# Patient Record
Sex: Male | Born: 1955
Health system: Southern US, Community
[De-identification: ages and names within clinical notes are randomized; demographics above are authoritative.]

## PROBLEM LIST (undated history)

## (undated) DIAGNOSIS — I219 Acute myocardial infarction, unspecified: Secondary | ICD-10-CM

## (undated) DIAGNOSIS — Q211 Atrial septal defect: Secondary | ICD-10-CM

## (undated) DIAGNOSIS — Z8673 Personal history of transient ischemic attack (TIA), and cerebral infarction without residual deficits: Secondary | ICD-10-CM

## (undated) DIAGNOSIS — K118 Other diseases of salivary glands: Secondary | ICD-10-CM

## (undated) DIAGNOSIS — R600 Localized edema: Secondary | ICD-10-CM

## (undated) DIAGNOSIS — I639 Cerebral infarction, unspecified: Secondary | ICD-10-CM

## (undated) DIAGNOSIS — E785 Hyperlipidemia, unspecified: Secondary | ICD-10-CM

## (undated) DIAGNOSIS — I519 Heart disease, unspecified: Secondary | ICD-10-CM

## (undated) DIAGNOSIS — I10 Essential (primary) hypertension: Secondary | ICD-10-CM

## (undated) DIAGNOSIS — I2510 Atherosclerotic heart disease of native coronary artery without angina pectoris: Secondary | ICD-10-CM

## (undated) DIAGNOSIS — R7303 Prediabetes: Secondary | ICD-10-CM

## (undated) DIAGNOSIS — F32A Depression, unspecified: Secondary | ICD-10-CM

## (undated) DIAGNOSIS — F329 Major depressive disorder, single episode, unspecified: Secondary | ICD-10-CM

## (undated) DIAGNOSIS — K111 Hypertrophy of salivary gland: Secondary | ICD-10-CM

## (undated) HISTORY — DX: Hyperlipidemia, unspecified: E78.5

## (undated) HISTORY — DX: Prediabetes: R73.03

## (undated) HISTORY — DX: Localized edema: R60.0

## (undated) HISTORY — DX: Other diseases of salivary glands: K11.8

## (undated) HISTORY — DX: Acute myocardial infarction, unspecified: I21.9

## (undated) HISTORY — DX: Hypertrophy of salivary gland: K11.1

## (undated) HISTORY — DX: Depression, unspecified: F32.A

## (undated) HISTORY — DX: Essential (primary) hypertension: I10

## (undated) HISTORY — DX: Personal history of transient ischemic attack (TIA), and cerebral infarction without residual deficits: Z86.73

---
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
## 2017-03-23 DIAGNOSIS — I639 Cerebral infarction, unspecified: Secondary | ICD-10-CM

## 2017-03-23 HISTORY — DX: Cerebral infarction, unspecified: I63.9

## 2017-11-23 ENCOUNTER — Other Ambulatory Visit: Payer: Self-pay

## 2017-11-23 ENCOUNTER — Emergency Department (HOSPITAL_COMMUNITY): Payer: Self-pay

## 2017-11-23 ENCOUNTER — Inpatient Hospital Stay (HOSPITAL_COMMUNITY)
Admission: EM | Admit: 2017-11-23 | Discharge: 2017-11-25 | DRG: 065 | Discharge status: Against Medical Advice | Payer: Self-pay | Attending: Internal Medicine | Admitting: Internal Medicine

## 2017-11-23 ENCOUNTER — Encounter (HOSPITAL_COMMUNITY): Payer: Self-pay

## 2017-11-23 DIAGNOSIS — I639 Cerebral infarction, unspecified: Secondary | ICD-10-CM

## 2017-11-23 DIAGNOSIS — F0150 Vascular dementia without behavioral disturbance: Secondary | ICD-10-CM | POA: Diagnosis present

## 2017-11-23 DIAGNOSIS — R270 Ataxia, unspecified: Secondary | ICD-10-CM | POA: Diagnosis present

## 2017-11-23 DIAGNOSIS — N289 Disorder of kidney and ureter, unspecified: Secondary | ICD-10-CM | POA: Diagnosis present

## 2017-11-23 DIAGNOSIS — Q211 Atrial septal defect: Secondary | ICD-10-CM

## 2017-11-23 DIAGNOSIS — I255 Ischemic cardiomyopathy: Secondary | ICD-10-CM | POA: Diagnosis present

## 2017-11-23 DIAGNOSIS — Z955 Presence of coronary angioplasty implant and graft: Secondary | ICD-10-CM

## 2017-11-23 DIAGNOSIS — R609 Edema, unspecified: Secondary | ICD-10-CM | POA: Diagnosis present

## 2017-11-23 DIAGNOSIS — G9340 Encephalopathy, unspecified: Secondary | ICD-10-CM | POA: Diagnosis present

## 2017-11-23 DIAGNOSIS — E663 Overweight: Secondary | ICD-10-CM | POA: Diagnosis present

## 2017-11-23 DIAGNOSIS — H53461 Homonymous bilateral field defects, right side: Secondary | ICD-10-CM | POA: Diagnosis present

## 2017-11-23 DIAGNOSIS — G8191 Hemiplegia, unspecified affecting right dominant side: Secondary | ICD-10-CM | POA: Diagnosis present

## 2017-11-23 DIAGNOSIS — I63432 Cerebral infarction due to embolism of left posterior cerebral artery: Principal | ICD-10-CM | POA: Diagnosis present

## 2017-11-23 DIAGNOSIS — I252 Old myocardial infarction: Secondary | ICD-10-CM

## 2017-11-23 DIAGNOSIS — Z6828 Body mass index (BMI) 28.0-28.9, adult: Secondary | ICD-10-CM

## 2017-11-23 DIAGNOSIS — I2510 Atherosclerotic heart disease of native coronary artery without angina pectoris: Secondary | ICD-10-CM | POA: Diagnosis present

## 2017-11-23 DIAGNOSIS — Z8673 Personal history of transient ischemic attack (TIA), and cerebral infarction without residual deficits: Secondary | ICD-10-CM

## 2017-11-23 DIAGNOSIS — K118 Other diseases of salivary glands: Secondary | ICD-10-CM | POA: Diagnosis present

## 2017-11-23 DIAGNOSIS — R471 Dysarthria and anarthria: Secondary | ICD-10-CM | POA: Diagnosis present

## 2017-11-23 DIAGNOSIS — R410 Disorientation, unspecified: Secondary | ICD-10-CM

## 2017-11-23 DIAGNOSIS — R29810 Facial weakness: Secondary | ICD-10-CM | POA: Diagnosis present

## 2017-11-23 DIAGNOSIS — F17210 Nicotine dependence, cigarettes, uncomplicated: Secondary | ICD-10-CM | POA: Diagnosis present

## 2017-11-23 DIAGNOSIS — R29705 NIHSS score 5: Secondary | ICD-10-CM | POA: Diagnosis present

## 2017-11-23 HISTORY — DX: Heart disease, unspecified: I51.9

## 2017-11-23 LAB — COMPREHENSIVE METABOLIC PANEL
ALT: 49 U/L — ABNORMAL HIGH (ref 0–44)
AST: 27 U/L (ref 15–41)
Albumin: 4 g/dL (ref 3.5–5.0)
Alkaline Phosphatase: 94 U/L (ref 38–126)
Anion gap: 12 (ref 5–15)
BUN: 13 mg/dL (ref 8–23)
CO2: 21 mmol/L — ABNORMAL LOW (ref 22–32)
Calcium: 9.2 mg/dL (ref 8.9–10.3)
Chloride: 106 mmol/L (ref 98–111)
Creatinine, Ser: 1.31 mg/dL — ABNORMAL HIGH (ref 0.61–1.24)
GFR calc Af Amer: >60 mL/min (ref >60)
GFR calc non Af Amer: 56 mL/min — ABNORMAL LOW (ref >60)
Glucose, Bld: 130 mg/dL — ABNORMAL HIGH (ref 70–99)
Potassium: 3.8 mmol/L (ref 3.5–5.1)
Sodium: 139 mmol/L (ref 135–145)
Total Bilirubin: 1 mg/dL (ref 0.3–1.2)
Total Protein: 7.1 g/dL (ref 6.5–8.1)

## 2017-11-23 LAB — CBC
HCT: 49.8 % (ref 39.0–52.0)
Hemoglobin: 17 g/dL (ref 13.0–17.0)
MCH: 31.4 pg (ref 26.0–34.0)
MCHC: 34.1 g/dL (ref 30.0–36.0)
MCV: 91.9 fL (ref 78.0–100.0)
Platelets: 245 10*3/uL (ref 150–400)
RBC: 5.42 MIL/uL (ref 4.22–5.81)
RDW: 12.8 % (ref 11.5–15.5)
WBC: 11.6 10*3/uL — ABNORMAL HIGH (ref 4.0–10.5)

## 2017-11-23 LAB — CBG MONITORING, ED: Glucose-Capillary: 120 mg/dL — ABNORMAL HIGH (ref 70–99)

## 2017-11-23 MED ORDER — ACETAMINOPHEN 500 MG PO TABS
500.0000 mg | ORAL_TABLET | Freq: Once | ORAL | Status: AC
  Administered 2017-11-23: 500 mg via ORAL
  Filled 2017-11-23: qty 1

## 2017-11-23 NOTE — ED Triage Notes (Signed)
Pt from home with increased confusion over the last week.  Wife reports increased difficulty with fine motor skills.  Mass to the right jaw line noticed and patient does not remember seeing it before, mass is solid and non movable.  Also reports headache and just wants to go to sleep.

## 2017-11-23 NOTE — ED Provider Notes (Signed)
[facility] EMERGENCY DEPARTMENT Provider Note   CSN: [id number] Arrival date & time: 11/23/17  2101     History   Chief Complaint Chief Complaint  Patient presents with  . Altered Mental Status    HPI James [name] is a 62 y.o. male.  The history is provided by the patient.  Altered Mental Status   This is a new problem. The current episode started more than 2 days ago. The problem has not changed since onset.Associated symptoms include confusion and weakness. Pertinent negatives include no seizures. His past medical history does not include head trauma.    History reviewed. No pertinent past medical history.  Patient Active Problem List   Diagnosis Date Noted  . Acute encephalopathy 11/24/2017    History reviewed. No pertinent surgical history.      Home Medications    Prior to Admission medications   Not on File    Family History History reviewed. No pertinent family history.  Social History Social History   Tobacco Use  . Smoking status: Current Some Day Smoker  . Smokeless tobacco: Never Used  Substance Use Topics  . Alcohol use: Never    Frequency: Never  . Drug use: Never     Allergies   Patient has no known allergies.   Review of Systems Review of Systems  Constitutional: Negative for chills and fever.  HENT: Negative for ear pain and sore throat.        + right jaw facial mass for last 6 months  Eyes: Negative for pain and visual disturbance.  Respiratory: Negative for shortness of breath.   Cardiovascular: Negative for chest pain and palpitations.  Gastrointestinal: Negative for abdominal pain and vomiting.  Genitourinary: Negative for dysuria and hematuria.  Musculoskeletal: Negative for arthralgias and back pain.  Skin: Negative for wound.  Neurological: Positive for weakness. Negative for seizures and syncope.  Psychiatric/Behavioral: Positive for confusion.  All other systems reviewed and are  negative.    Physical Exam Updated Vital Signs BP (!) 136/92   Pulse 84   Temp 98.2 F (36.8 C) (Oral)   Resp 14   Ht 6' (1.829 m)   Wt 72.6 kg   SpO2 94%   BMI 21.70 kg/m   Physical Exam  Constitutional: He appears well-developed and well-nourished.  HENT:  Head: Normocephalic and atraumatic.  Eyes: Conjunctivae are normal.  Neck: Neck supple.  Cardiovascular: Normal rate and regular rhythm.  No murmur heard. Pulmonary/Chest: Effort normal and breath sounds normal. No respiratory distress.  Abdominal: Soft. There is no tenderness.  Musculoskeletal: He exhibits no edema.  Neurological: He is alert.  Oriented to self, 5/5 strength with flexion and extension of bilateral upper and lower extremities.  Slightly diminished sensation over the right upper and lower extremity.  Finger-to-nose normal both sides.  Mild right-sided pronator drift.  Skin: Skin is warm and dry.  Psychiatric: He has a normal mood and affect.  Nursing note and vitals reviewed.    ED Treatments / Results  Labs (all labs ordered are listed, but only abnormal results are displayed) Labs Reviewed  COMPREHENSIVE METABOLIC PANEL - Abnormal; Notable for the following components:      Result Value   CO2 21 (*)    Glucose, Bld 130 (*)    Creatinine, Ser 1.31 (*)    ALT 49 (*)    GFR calc non Af Amer 56 (*)    All other components within normal limits  CBC - Abnormal; Notable for  the following components:   WBC 11.6 (*)    All other components within normal limits  CBG MONITORING, ED - Abnormal; Notable for the following components:   Glucose-Capillary 120 (*)    All other components within normal limits    EKG EKG Interpretation  Date/Time:  Tuesday November 23 2017 21:10:06 EDT Ventricular Rate:  96 PR Interval:    QRS Duration: 93 QT Interval:  352 QTC Calculation: 445 R Axis:   -34 Text Interpretation:  Sinus rhythm Probable left atrial enlargement Left axis deviation Abnormal R-wave  progression, early transition Abnormal ekg Confirmed by Carmin Muskrat [phone]) on 11/23/2017 9:20:14 PM   Radiology Ct Head Wo Contrast  Result Date: 11/23/2017 CLINICAL DATA:  Ataxia EXAM: CT HEAD WITHOUT CONTRAST TECHNIQUE: Contiguous axial images were obtained from the base of the skull through the vertex without intravenous contrast. COMPARISON:  None. FINDINGS: Brain: No hemorrhage or intracranial mass is visualized. Mild atrophy. Mild small vessel ischemic changes of the white matter. Probable chronic left thalamic infarct and small suspected chronic left occipital infarct. Age indeterminate infarct within the medial left temporal lobe. Ventricles are nonenlarged. Vascular: No hyperdense vessels. Vertebral artery and carotid vascular calcification Skull: No fracture Sinuses/Orbits: Mucosal thickening in the ethmoid and maxillary sinuses. Other: None IMPRESSION: 1. Age indeterminate infarct within the medial left temporal lobe. Small infarcts in the left occipital lobe and left thalamus, probably chronic. 2. Atrophy with mild small vessel ischemic changes of the white matter. Electronically Signed   By: Donavan Foil M.D.   On: 11/23/2017 22:48    Procedures Procedures (including critical care time)  Medications Ordered in ED Medications  acetaminophen (TYLENOL) tablet 500 mg (500 mg Oral Given 11/23/17 2252)     Initial Impression / Assessment and Plan / ED Course  I have reviewed the triage vital signs and the nursing notes.  Pertinent labs & imaging results that were available during my care of the patient were reviewed by me and considered in my medical decision making (see chart for details).     Patient is a 62 year old male with past medical history CAD who presents with confusion and nonspecific weakness.  Physical exam reveals ataxia and right-sided pronator drift.  Patient also has subjectively decreased sensation over the right side.  CT head shows age-indeterminate stroke.   Point-of-care glucose 120.  CBC reveals mild leukocytosis.  CMP reveals no electrolyte abnormalities, creatinine 1.31, and no significantly elevated LFTs.  Patient remains hemodynamically stable in the emergency department. Discussed with Dr. Cheral Marker at 12:55 AM.  Patient will be admitted to internal medicine.  Patient care supervised by Dr. Vanita Panda.  Irven Baltimore, MD  Final Clinical Impressions(s) / ED Diagnoses   Final diagnoses:  None    ED Discharge Orders    None       Irven Baltimore, MD 11/24/17 1191    Carmin Muskrat, MD 11/24/17 [phone]

## 2017-11-24 ENCOUNTER — Observation Stay (HOSPITAL_COMMUNITY): Payer: Self-pay

## 2017-11-24 ENCOUNTER — Encounter (HOSPITAL_COMMUNITY): Payer: Self-pay | Admitting: Family Medicine

## 2017-11-24 ENCOUNTER — Observation Stay (HOSPITAL_BASED_OUTPATIENT_CLINIC_OR_DEPARTMENT_OTHER): Payer: Self-pay

## 2017-11-24 ENCOUNTER — Other Ambulatory Visit: Payer: Self-pay | Admitting: Cardiology

## 2017-11-24 DIAGNOSIS — R609 Edema, unspecified: Secondary | ICD-10-CM

## 2017-11-24 DIAGNOSIS — N289 Disorder of kidney and ureter, unspecified: Secondary | ICD-10-CM

## 2017-11-24 DIAGNOSIS — G459 Transient cerebral ischemic attack, unspecified: Secondary | ICD-10-CM

## 2017-11-24 DIAGNOSIS — Z8673 Personal history of transient ischemic attack (TIA), and cerebral infarction without residual deficits: Secondary | ICD-10-CM

## 2017-11-24 DIAGNOSIS — I639 Cerebral infarction, unspecified: Secondary | ICD-10-CM

## 2017-11-24 DIAGNOSIS — G9340 Encephalopathy, unspecified: Secondary | ICD-10-CM | POA: Diagnosis present

## 2017-11-24 LAB — BASIC METABOLIC PANEL
Anion gap: 10 (ref 5–15)
BUN: 16 mg/dL (ref 8–23)
CO2: 23 mmol/L (ref 22–32)
Calcium: 8.9 mg/dL (ref 8.9–10.3)
Chloride: 107 mmol/L (ref 98–111)
Creatinine, Ser: 1.04 mg/dL (ref 0.61–1.24)
GFR calc Af Amer: >60 mL/min (ref >60)
GFR calc non Af Amer: >60 mL/min (ref >60)
Glucose, Bld: 93 mg/dL (ref 70–99)
Potassium: 3.7 mmol/L (ref 3.5–5.1)
Sodium: 140 mmol/L (ref 135–145)

## 2017-11-24 LAB — LIPID PANEL
Cholesterol: 204 mg/dL — ABNORMAL HIGH (ref 0–200)
HDL: 34 mg/dL — ABNORMAL LOW (ref >40)
LDL Cholesterol: 143 mg/dL — ABNORMAL HIGH (ref 0–99)
Total CHOL/HDL Ratio: 6 RATIO
Triglycerides: 135 mg/dL (ref <150)
VLDL: 27 mg/dL (ref 0–40)

## 2017-11-24 LAB — RAPID URINE DRUG SCREEN, HOSP PERFORMED
Amphetamines: NOT DETECTED
Barbiturates: NOT DETECTED
Benzodiazepines: NOT DETECTED
Cocaine: NOT DETECTED
Opiates: NOT DETECTED
Tetrahydrocannabinol: NOT DETECTED

## 2017-11-24 LAB — URINALYSIS, COMPLETE (UACMP) WITH MICROSCOPIC
Bacteria, UA: NONE SEEN
Bilirubin Urine: NEGATIVE
Glucose, UA: NEGATIVE mg/dL
Hgb urine dipstick: NEGATIVE
Ketones, ur: NEGATIVE mg/dL
Leukocytes, UA: NEGATIVE
Nitrite: NEGATIVE
Protein, ur: NEGATIVE mg/dL
Specific Gravity, Urine: 1.024 (ref 1.005–1.030)
pH: 5 (ref 5.0–8.0)

## 2017-11-24 LAB — ECHOCARDIOGRAM COMPLETE
Height: 72 in
Weight: 3319.25 oz

## 2017-11-24 LAB — HIV ANTIBODY (ROUTINE TESTING W REFLEX): HIV Screen 4th Generation wRfx: NONREACTIVE

## 2017-11-24 LAB — HEMOGLOBIN A1C
Hgb A1c MFr Bld: 5.8 % — ABNORMAL HIGH (ref 4.8–5.6)
Mean Plasma Glucose: 119.76 mg/dL

## 2017-11-24 LAB — VITAMIN B12: Vitamin B-12: 310 pg/mL (ref 180–914)

## 2017-11-24 LAB — TSH: TSH: 1.255 u[IU]/mL (ref 0.350–4.500)

## 2017-11-24 MED ORDER — SODIUM CHLORIDE 0.9% FLUSH
3.0000 mL | Freq: Two times a day (BID) | INTRAVENOUS | Status: DC
  Administered 2017-11-24 – 2017-11-25 (×3): 3 mL via INTRAVENOUS

## 2017-11-24 MED ORDER — SODIUM CHLORIDE 0.9 % IV SOLN
INTRAVENOUS | Status: AC
  Administered 2017-11-24: 04:00:00 via INTRAVENOUS

## 2017-11-24 MED ORDER — ACETAMINOPHEN 325 MG PO TABS: 650.0000 mg | ORAL_TABLET | Freq: Four times a day (QID) | ORAL | Status: DC | PRN

## 2017-11-24 MED ORDER — ONDANSETRON HCL 4 MG/2ML IJ SOLN: 4.0000 mg | Freq: Four times a day (QID) | INTRAMUSCULAR | Status: DC | PRN

## 2017-11-24 MED ORDER — ACETAMINOPHEN 650 MG RE SUPP: 650.0000 mg | Freq: Four times a day (QID) | RECTAL | Status: DC | PRN

## 2017-11-24 MED ORDER — ENOXAPARIN SODIUM 40 MG/0.4ML ~~LOC~~ SOLN
40.0000 mg | SUBCUTANEOUS | Status: DC
  Administered 2017-11-24 – 2017-11-25 (×2): 40 mg via SUBCUTANEOUS
  Filled 2017-11-24 (×2): qty 0.4

## 2017-11-24 MED ORDER — SENNOSIDES-DOCUSATE SODIUM 8.6-50 MG PO TABS: 1.0000 | ORAL_TABLET | Freq: Every evening | ORAL | Status: DC | PRN

## 2017-11-24 MED ORDER — ATORVASTATIN CALCIUM 80 MG PO TABS
80.0000 mg | ORAL_TABLET | Freq: Every day | ORAL | Status: DC
  Administered 2017-11-24: 80 mg via ORAL
  Filled 2017-11-24: qty 1

## 2017-11-24 MED ORDER — ASPIRIN 300 MG RE SUPP: 300.0000 mg | Freq: Every day | RECTAL | Status: DC | PRN

## 2017-11-24 MED ORDER — ASPIRIN 325 MG PO TABS
325.0000 mg | ORAL_TABLET | Freq: Every day | ORAL | Status: DC
  Administered 2017-11-24: 325 mg via ORAL
  Filled 2017-11-24: qty 1

## 2017-11-24 MED ORDER — ONDANSETRON HCL 4 MG PO TABS: 4.0000 mg | ORAL_TABLET | Freq: Four times a day (QID) | ORAL | Status: DC | PRN

## 2017-11-24 NOTE — Progress Notes (Addendum)
The patient seen and examined, admitted earlier this morning by Dr.Opyd Briefly 62 year old male who does not seek regular medical care, remote history of CAD and stents from 5 years ago in ER, was brought to the emergency room by his girlfriend for evaluation of ongoing confusion, fine motor weakness in coordination for approximately 1 week -CT in the emergency room noted subacute infarcts in the medial left temporal lobe and left occipital lobe, left thalamus with atrophy and mild small vessel white matter changes.  1. Subacute CVA -With ongoing physical and cognitive decline -MRI noted Patchy L PCA infarcts involving the L occipital, splenium and thalamus, embolic source suspected -Neurology consulting -Follow-up 2-D echocardiogram and carotid duplex -LDL 143, Hba1c is 5.8 -PT/OT/SLP evaluations -continue aspirin 325 mg daily, started statin  2. Mild Renal insufficiency -continue gentle hydration for 24 hours -Baseline creatinine unknown  3. Right parotid swelling -recommend nonurgent ENT follow-up for this  4. Prior CAD -friend reports history of MI and 2 stents, in [place] approximately 4-5 years ago  Domenic Polite, MD

## 2017-11-24 NOTE — Progress Notes (Addendum)
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 The patient does not take an antiplatelet medication or an anticoagulant. He has no prior history of stroke. CT head obtained in the ED revealed a late-subacute appearing infarct within the medial left temporal lobe and similar likely late-subacute small infarcts in the left occipital lobe and left thalamus.   INTERVAL HISTORY His significant other is at the bedside.  She recounted HPI. He remains confused this am. He still cannot see to the R. Patient has a negative outlook about living according to his wife d/t deaths of his parents.  Dr. Leonie [name] discussed stroke, etiology, testing, prognosis with he and his significant other. Pt is not understanding. His sister is his next-of-kin, she lives in [place].  Vitals:   11/24/17 0130 11/24/17 0215 11/24/17 0430 11/24/17 0754  BP: 130/83 126/88 112/82 120/82  Pulse: 73 79 77 75  Resp: 18  16   Temp:  98.4 F (36.9 C) 98.1 F (36.7 C)   TempSrc:  Oral Oral   SpO2: 95% 95% 95% 98%  Weight:  94.1 kg    Height:        CBC:  Recent Labs  Lab 11/23/17 2112  WBC 11.6*  HGB 17.0  HCT 49.8  MCV 91.9  PLT 829    Basic Metabolic Panel:  Recent Labs  Lab 11/23/17 2112 11/24/17 0351  NA 139 140  K 3.8 3.7  CL 106 107  CO2 21* 23  GLUCOSE 130* 93  BUN 13 16  CREATININE 1.31* 1.04  CALCIUM 9.2 8.9   Lipid Panel:     Component Value Date/Time   CHOL 204 (H) 11/24/2017 0351   TRIG 135 11/24/2017 0351   HDL 34 (L) 11/24/2017 0351   CHOLHDL 6.0 11/24/2017 0351   VLDL 27 11/24/2017 0351   LDLCALC 143 (H) 11/24/2017 0351   HgbA1c:  Lab Results  Component Value Date   HGBA1C 5.8 (H) 11/24/2017   Urine Drug Screen:     Component Value Date/Time   LABOPIA NONE DETECTED 11/24/2017 0644    COCAINSCRNUR NONE DETECTED 11/24/2017 0644   LABBENZ NONE DETECTED 11/24/2017 0644   AMPHETMU NONE DETECTED 11/24/2017 0644   THCU NONE DETECTED 11/24/2017 0644   LABBARB NONE DETECTED 11/24/2017 0644    Alcohol Level No results found for: ETH  IMAGING Ct Head Wo Contrast  Result Date: 11/23/2017 CLINICAL DATA:  Ataxia EXAM: CT HEAD WITHOUT CONTRAST TECHNIQUE: Contiguous axial images were obtained from the base of the skull through the vertex without intravenous contrast. COMPARISON:  None. FINDINGS: Brain: No hemorrhage or intracranial mass is visualized. Mild atrophy. Mild small vessel ischemic changes of the white matter. Probable chronic left thalamic infarct and small suspected chronic left occipital infarct. Age indeterminate infarct within the medial left temporal lobe. Ventricles are nonenlarged. Vascular: No hyperdense vessels. Vertebral artery and carotid vascular calcification Skull: No fracture Sinuses/Orbits: Mucosal thickening in the ethmoid and maxillary sinuses. Other: None IMPRESSION: 1. Age indeterminate infarct within the medial left temporal lobe. Small infarcts in the left occipital lobe and left thalamus, probably chronic. 2. Atrophy with mild small vessel ischemic changes of the white matter. Electronically Signed   By: Donavan Foil M.D.   On: 11/23/2017 22:48   Mr James [name] Head Wo Contrast  Result Date: 11/24/2017 CLINICAL  DATA:  Initial evaluation for acute altered mental status. EXAM: MRI HEAD WITHOUT CONTRAST MRA HEAD WITHOUT CONTRAST TECHNIQUE: Multiplanar, multiecho pulse sequences of the brain and surrounding structures were obtained without intravenous contrast. Angiographic images of the head were obtained using MRA technique without contrast. COMPARISON:  Comparison made with prior CT from 11/23/2017 FINDINGS: MRI HEAD FINDINGS Brain: Diffuse prominence of the CSF containing spaces compatible with generalized cerebral atrophy. Patchy and confluent T2/FLAIR hyperintensity  within the periventricular deep white matter both cerebral hemispheres most compatible with chronic small vessel ischemic disease, moderate nature. Chronic microvascular ischemic changes present within the pons. Patchy multifocal areas of restricted diffusion seen involving the parasagittal left temporal and occipital lobes, left thalamus, and left splenium, consistent with acute left PCA territory infarcts. Slight extension into the left cerebral peduncle and left midbrain. No associated hemorrhage or mass effect. No other evidence for acute or subacute ischemia. Gray-white matter differentiation otherwise maintained. No evidence for acute or chronic intracranial hemorrhage. No mass lesion, midline shift or mass effect. No hydrocephalus. No extra-axial fluid collection. Pituitary gland normal. Vascular: Major intracranial vascular flow voids maintained. Skull and upper cervical spine: Craniocervical junction within normal limits. Bone marrow signal intensity somewhat diffusely decreased on T1 weighted imaging, most commonly related to anemia, smoking, or obesity. No focal marrow replacing lesion. No scalp soft tissue abnormality. Sinuses/Orbits: Globes and orbital soft tissues within normal limits. Scattered mucosal thickening throughout the paranasal sinuses. Superimposed left maxillary sinus retention cyst noted. Trace opacity left mastoid air cells. Other: 3.4 cm lobulated well-circumscribed T2 hypointense lesion within the right parotid gland (series 19, image 17). Additional probable lesion within the left parotid gland measures 13 mm (series 19, image 17). MRA HEAD FINDINGS ANTERIOR CIRCULATION: Examination mildly degraded by motion artifact. Distal cervical segments of the internal carotid arteries are widely patent with antegrade flow. Petrous segments patent bilaterally. Mild atheromatous irregularity within the cavernous/supraclinoid ICAs without hemodynamically significant stenosis. A1 segments, anterior  communicating artery common anterior cerebral arteries widely patent bilaterally. M1 segments patent without stenosis. No proximal M2 occlusion. Distal MCA branches well perfused and symmetric. Mild distal small vessel atheromatous irregularity. POSTERIOR CIRCULATION: Vertebral arteries code dominant and patent to the vertebrobasilar junction without stenosis. Posterior inferior cerebral arteries patent proximally. Basilar widely patent to its distal aspect. Superior cerebral arteries patent bilaterally. Both of the posterior cerebral artery supplied via the basilar. Right PCA widely patent to its distal aspect. There is abrupt occlusion of the proximal left PCA, left P2 segment. No intracranial aneurysm. IMPRESSION: MRI HEAD IMPRESSION: 1. Patchy multifocal left PCA territory infarcts involving the parasagittal left temporal occipital region, left thalamus, and left splenium. No associated hemorrhage or mass effect. 2. Atrophy with moderate chronic microvascular ischemic disease. 3. 3.4 cm mass within the right parotid gland, with additional probable 13 mm left parotid lesion. Findings are nonspecific. Follow-up with nonemergent ENT consultation and referral on an outpatient basis could be performed for further evaluation as clinically warranted. MRA HEAD IMPRESSION: 1. Acute proximal left P2 occlusion. 2. Major intracranial arterial vasculature otherwise widely patent with no hemodynamically significant or correctable stenosis identified. 3. Mild distal small vessel atheromatous irregularity. Electronically Signed   By: Jeannine Boga M.D.   On: 11/24/2017 06:20   Mr Brain Wo Contrast  Result Date: 11/24/2017 CLINICAL DATA:  Initial evaluation for acute altered mental status. EXAM: MRI HEAD WITHOUT CONTRAST MRA HEAD WITHOUT CONTRAST TECHNIQUE: Multiplanar, multiecho pulse sequences of the brain and surrounding structures were obtained without intravenous  contrast. Angiographic images of the head were  obtained using MRA technique without contrast. COMPARISON:  Comparison made with prior CT from 11/23/2017 FINDINGS: MRI HEAD FINDINGS Brain: Diffuse prominence of the CSF containing spaces compatible with generalized cerebral atrophy. Patchy and confluent T2/FLAIR hyperintensity within the periventricular deep white matter both cerebral hemispheres most compatible with chronic small vessel ischemic disease, moderate nature. Chronic microvascular ischemic changes present within the pons. Patchy multifocal areas of restricted diffusion seen involving the parasagittal left temporal and occipital lobes, left thalamus, and left splenium, consistent with acute left PCA territory infarcts. Slight extension into the left cerebral peduncle and left midbrain. No associated hemorrhage or mass effect. No other evidence for acute or subacute ischemia. Gray-white matter differentiation otherwise maintained. No evidence for acute or chronic intracranial hemorrhage. No mass lesion, midline shift or mass effect. No hydrocephalus. No extra-axial fluid collection. Pituitary gland normal. Vascular: Major intracranial vascular flow voids maintained. Skull and upper cervical spine: Craniocervical junction within normal limits. Bone marrow signal intensity somewhat diffusely decreased on T1 weighted imaging, most commonly related to anemia, smoking, or obesity. No focal marrow replacing lesion. No scalp soft tissue abnormality. Sinuses/Orbits: Globes and orbital soft tissues within normal limits. Scattered mucosal thickening throughout the paranasal sinuses. Superimposed left maxillary sinus retention cyst noted. Trace opacity left mastoid air cells. Other: 3.4 cm lobulated well-circumscribed T2 hypointense lesion within the right parotid gland (series 19, image 17). Additional probable lesion within the left parotid gland measures 13 mm (series 19, image 17). MRA HEAD FINDINGS ANTERIOR CIRCULATION: Examination mildly degraded by motion  artifact. Distal cervical segments of the internal carotid arteries are widely patent with antegrade flow. Petrous segments patent bilaterally. Mild atheromatous irregularity within the cavernous/supraclinoid ICAs without hemodynamically significant stenosis. A1 segments, anterior communicating artery common anterior cerebral arteries widely patent bilaterally. M1 segments patent without stenosis. No proximal M2 occlusion. Distal MCA branches well perfused and symmetric. Mild distal small vessel atheromatous irregularity. POSTERIOR CIRCULATION: Vertebral arteries code dominant and patent to the vertebrobasilar junction without stenosis. Posterior inferior cerebral arteries patent proximally. Basilar widely patent to its distal aspect. Superior cerebral arteries patent bilaterally. Both of the posterior cerebral artery supplied via the basilar. Right PCA widely patent to its distal aspect. There is abrupt occlusion of the proximal left PCA, left P2 segment. No intracranial aneurysm. IMPRESSION: MRI HEAD IMPRESSION: 1. Patchy multifocal left PCA territory infarcts involving the parasagittal left temporal occipital region, left thalamus, and left splenium. No associated hemorrhage or mass effect. 2. Atrophy with moderate chronic microvascular ischemic disease. 3. 3.4 cm mass within the right parotid gland, with additional probable 13 mm left parotid lesion. Findings are nonspecific. Follow-up with nonemergent ENT consultation and referral on an outpatient basis could be performed for further evaluation as clinically warranted. MRA HEAD IMPRESSION: 1. Acute proximal left P2 occlusion. 2. Major intracranial arterial vasculature otherwise widely patent with no hemodynamically significant or correctable stenosis identified. 3. Mild distal small vessel atheromatous irregularity. Electronically Signed   By: Jeannine Boga M.D.   On: 11/24/2017 06:20    PHYSICAL EXAM  pleasant middle-aged Caucasian male currently  not in distress. . Afebrile. Head is nontraumatic. Neck is supple without bruit.    Cardiac exam no murmur or gallop. Lungs are clear to auscultation. Distal pulses are well felt. Neurological Exam :  Awake alert oriented to time and place only. Diminished attention, registration and poor recall 0/3. Follows simple midline and one-step commands. Speech is clear without dysarthria or aphasia. Able  to name repeat comprehend well. Extraocular moments are full range without nystagmus. Right partial homonymous hemianopsia. Fundi were not visualized. Mild right lower facial weakness. Tongue is midline. Motor system exam revealed no upper or lower extremity drift but mild weakness of right grip and intrinsic hand muscles. Orbits left over right upper extremity. Mild weakness of right hip flexors and ankle dorsiflexors only. Sensation appears intact bilaterally. Coordination is accurate. Gait was not tested. ASSESSMENT/PLAN [name] is a 62 y.o. male with history of tobacco use presenting with confusion, dysarthria, and decreased fine motor movement x 1 week.   Stroke:   Patchy L PCA infarcts involving the L occipital, splenium and thalamus, infarct embolic secondary to unknown source  Resultant: R HH, short term memory deficitx  CT head old medial L temporal lobe infarct. Probable old L occipital and L thalamic infarcts. Small vessel disease. Atrophy.   MRI  Multifocal Patchy L PCA infarcts. Small vessel disease. Atrophy. R parotid 3.4 cm mass. L parotid 13 mm lesion.  MRA  Proximal L P2 occlusion. Mild Small vessel disease  Carotid Doppler  B ICA 1-39% stenosis, VAs antegrade   2D Echo  pending   TEE to look for embolic source. Arranged with [facility] for tomorrow.  If positive for PFO (patent foramen ovale), check bilateral lower extremity venous dopplers to rule out DVT as possible source of stroke. (I have made patient NPO after midnight tonight).  If TEE  negative, a [place] electrophysiologist will consult and consider placement of an implantable loop recorder to evaluate for atrial fibrillation as etiology of stroke. This has been explained to patient/family by Dr. Leonie [name] and they are agreeable.   LDL 143  HgbA1c 5.8  Lovenox 40 mg sq daily for VTE prophylaxis  No antithrombotic prior to admission, now on aspirin 325 mg daily. Continue aspirin for secondary stroke prevention.  Therapy recommendations:  pending   Disposition:  pending   Hyperlipidemia  Home meds:  No statin  LDL 143, goal < 70  Add lipitor 80  Continue statin at discharge  Other Stroke Risk Factors  Cigarette smoker, advised to stop smoking. Discussed quitting options. Has tried nicotine patches in the past without success.  ETOH use, advised to drink no more than 2 drink(s) a day  Overweight, Body mass index is 28.14 kg/m., recommend weight loss, diet and exercise as appropriate   CAD with MI in his 5s   Other Active Problems  B parotid lesions, agree with further OP evaluation. Has lad "lump" x 8 mons per significant other  Renal insufficiency improved 1.31->1.04  Hospital day # 0  Burnetta Sabin, MSN, APRN, ANVP-BC, AGPCNP-BC Advanced Practice Stroke Nurse [place] for Schedule & Pager information 11/24/2017 10:44 AM  I have personally examined this patient, reviewed notes, independently viewed imaging studies, participated in medical decision making and plan of care.ROS completed by me personally and pertinent positives fully documented  I have made any additions or clarifications directly to the above note. Agree with note above. He is present with embolic left posterior cerebral artery infarct and needs ongoing stroke evaluation. Transesophageal echocardiogram and long-term cardiac monitoring if no obvious etiology is found on the testing today.long discussion with patient, girlfriend and Dr.  Broadus John and answered questions. Greater than 50% time during this 35 minute visit was spent on counseling and coordination of care about his embolic stroke and answered questions  Antony Contras, MD Medical  Director [facility] Pager: [phone] 11/24/2017 5:04 PM  To contact Stroke Continuity provider, please refer to [email or web address] After hours, contact General Neurology

## 2017-11-24 NOTE — Progress Notes (Signed)
  Echocardiogram 2D Echocardiogram has been performed.  James [name] 11/24/2017, 11:04 AM

## 2017-11-24 NOTE — ED Notes (Signed)
Neurologist Lindzen MD at bedside

## 2017-11-24 NOTE — Consult Note (Signed)
Referring Physician: Dr. Joylene Grapes    Chief Complaint: New onset of confusion with impaired motor skills  HPI: James [name] is an 62 y.o. male without a significant PMHx who presented to the ED with new onset dysarthria, confusion and difficulty with fine motor skills for approximately the past week. The patient does not take an antiplatelet medication or an anticoagulant. He has no prior history of stroke.   CT head obtained in the ED revealed a late-subacute appearing infarct within the medial left temporal lobe and similar likely late-subacute small infarcts in the left occipital lobe and left thalamus. The findings are all in the left PCA territory. Also noted is atrophy with mild small vessel ischemic changes of the white matter.  History reviewed. No pertinent past medical history.  History reviewed. No pertinent surgical history.  History reviewed. No pertinent family history. Social History:  reports that he has been smoking. He has never used smokeless tobacco. He reports that he does not drink alcohol or use drugs.  Allergies: No Known Allergies  Medications:  Denies taking any medications at home.   ROS: Complains of right jaw swelling. Other ROS documentation as per HPI. Of note, the patient is a poor historian, which limits obtaining a definitive ROS.   Physical Examination: Blood pressure 112/82, pulse 77, temperature 98.1 F (36.7 C), temperature source Oral, resp. rate 16, height 6' (1.829 m), weight 94.1 kg, SpO2 95 %.  HEENT: [place]/AT Lungs: Respirations unlabored Ext: Warm and well-perfused  Neurologic Examination: Mental Status: Drowsy with somewhat odd affect. Oriented to "1970", Tuesday, "October". He correctly identifies the city and state. Speech is fluent in the context of mild dysarthria. Repetition, comprehension of basic commands, and naming are intact.  Cranial Nerves: II:  Partial/crescentic homonymous right inferior quadrantanopsia. Right hemifield  extinction to DSS. PERRL.  III,IV, VI: EOMI. No ptosis V,VII: Temp sensation subjectively normal bilaterally. Mild right facial droop.  VIII: hearing intact to voice IX,X: No hypophonia XI: Symmetric  XII: midline tongue extension  Motor: RUE 4/5. RLE: 4+/5 LUE and LLE: 5/5 Sensory: Temp and light touch intact proximally x 4. No extinction Deep Tendon Reflexes:  2+ bilateral brachioradialis and biceps.  2+ right patella, 4+ left patella (crossed adductor) Right toe upgoing, left toe downgoing. Cerebellar: Right FNF with ataxia. Left FNF normal. Bilateral H-S normal.  Gait: Deferred  Results for orders placed or performed during the hospital encounter of 11/23/17 (from the past 48 hour(s))  Comprehensive metabolic panel     Status: Abnormal   Collection Time: 11/23/17  9:12 PM  Result Value Ref Range   Sodium 139 135 - 145 mmol/L   Potassium 3.8 3.5 - 5.1 mmol/L   Chloride 106 98 - 111 mmol/L   CO2 21 (L) 22 - 32 mmol/L   Glucose, Bld 130 (H) 70 - 99 mg/dL   BUN 13 8 - 23 mg/dL   Creatinine, Ser 1.31 (H) 0.61 - 1.24 mg/dL   Calcium 9.2 8.9 - 10.3 mg/dL   Total Protein 7.1 6.5 - 8.1 g/dL   Albumin 4.0 3.5 - 5.0 g/dL   AST 27 15 - 41 U/L   ALT 49 (H) 0 - 44 U/L   Alkaline Phosphatase 94 38 - 126 U/L   Total Bilirubin 1.0 0.3 - 1.2 mg/dL   GFR calc non Af Amer 56 (L) >60 mL/min   GFR calc Af Amer >60 >60 mL/min    Comment: (NOTE) The eGFR has been calculated using the CKD EPI equation.  This calculation has not been validated in all clinical situations. eGFR's persistently <60 mL/min signify possible Chronic Kidney Disease.    Anion gap 12 5 - 15    Comment: Performed at [facility] [address], [place], [place] [address]  CBC     Status: Abnormal   Collection Time: 11/23/17  9:12 PM  Result Value Ref Range   WBC 11.6 (H) 4.0 - 10.5 K/uL   RBC 5.42 4.22 - 5.81 MIL/uL   Hemoglobin 17.0 13.0 - 17.0 g/dL   HCT 49.8 39.0 - 52.0 %   MCV 91.9 78.0 - 100.0 fL    MCH 31.4 26.0 - 34.0 pg   MCHC 34.1 30.0 - 36.0 g/dL   RDW 12.8 11.5 - 15.5 %   Platelets 245 150 - 400 K/uL    Comment: Performed at [facility], [place] [address], [place], [place] [address]  CBG monitoring, ED     Status: Abnormal   Collection Time: 11/23/17  9:15 PM  Result Value Ref Range   Glucose-Capillary 120 (H) 70 - 99 mg/dL   Ct Head Wo Contrast  Result Date: 11/23/2017 CLINICAL DATA:  Ataxia EXAM: CT HEAD WITHOUT CONTRAST TECHNIQUE: Contiguous axial images were obtained from the base of the skull through the vertex without intravenous contrast. COMPARISON:  None. FINDINGS: Brain: No hemorrhage or intracranial mass is visualized. Mild atrophy. Mild small vessel ischemic changes of the white matter. Probable chronic left thalamic infarct and small suspected chronic left occipital infarct. Age indeterminate infarct within the medial left temporal lobe. Ventricles are nonenlarged. Vascular: No hyperdense vessels. Vertebral artery and carotid vascular calcification Skull: No fracture Sinuses/Orbits: Mucosal thickening in the ethmoid and maxillary sinuses. Other: None IMPRESSION: 1. Age indeterminate infarct within the medial left temporal lobe. Small infarcts in the left occipital lobe and left thalamus, probably chronic. 2. Atrophy with mild small vessel ischemic changes of the white matter. Electronically Signed   By: Donavan Foil M.D.   On: 11/23/2017 22:48    Assessment: 62 y.o. male with hypodensities on CT concerning for 3 late subacute ischemic infarctions in the left PCA territory 1. Presenting symptoms of dysarthria, confusion and difficulty with fine motor skills for approximately the past week. 2. Exam with dysarthria, right upper extremity ataxia, homonymous right inferior quadrantanopsia and right upper and lower extremity weakness 3. Stroke Risk Factors - No documented PMHx, but may have undiagnosed stroke risk factors  Plan: 1. HgbA1c, fasting lipid panel 2. MRI, MRA  of the brain without contrast 3. PT consult, OT consult, Speech consult 4. Echocardiogram 5. Carotid dopplers 6. Prophylactic therapy- ASA 325 mg po qd 7. Risk factor modification 8. Telemetry monitoring 9. Frequent neuro checks 10. BP management 11. Consider starting atorvastatin pending results of the stroke work up. Obtain CK level  '@Electronically'  signed: Dr. Kerney Elbe  11/24/2017, 4:41 AM

## 2017-11-24 NOTE — Evaluation (Signed)
Occupational Therapy Evaluation Patient Details Name: Atom Solivan MRN: [id number] DOB: 25-Jul-1955 Today's Date: 11/24/2017    History of Present Illness Bowden Boody is a 62 y.o. male who denies any significant past medical history, now presenting to the emergency department for evaluation of confusion, difficulty with fine motor skills, and swelling of the right jaw.  MRI positive for Patchy L PCA infarcts involving the L occipital, splenium and thalamus,   Clinical Impression   Pt is typically independent. He presents with impaired cognition, R visual field cut, R UE weakness and incoordination and impaired standing balance. He requires min assist for ADL and mobility. Pt is not aware of his deficits, nor did he recall them after therapist pointed them out. Recommending intensive rehab upon discharge.     Follow Up Recommendations  CIR;Supervision/Assistance - 24 hour    Equipment Recommendations       Recommendations for Other Services       Precautions / Restrictions Precautions Precautions: Fall Precaution Comments: R field cut      Mobility Bed Mobility      General bed mobility comments: pt in chair  Transfers Overall transfer level: Needs assistance Equipment used: None Transfers: Sit to/from Stand Sit to Stand: Supervision        General transfer comment: able to stand with close supervision, pt without regard for IV pole     Balance Overall balance assessment: Needs assistance   Sitting balance-Leahy Scale: Good       Standing balance-Leahy Scale: Fair Standing balance comment: stands unsupported without LOB                      ADL either performed or assessed with clinical judgement   ADL Overall ADL's : Needs assistance/impaired Eating/Feeding: Minimal assistance;Sitting Eating/Feeding Details (indicate cue type and reason): increased spillage Grooming: Wash/dry hands;Standing;Minimal assistance Grooming Details (indicate cue  type and reason): cues to locate paper towels Upper Body Bathing: Minimal assistance;Sitting   Lower Body Bathing: Minimal assistance;Sit to/from stand   Upper Body Dressing : Minimal assistance;Sitting   Lower Body Dressing: Minimal assistance;Sit to/from stand   Toilet Transfer: Ambulation;Minimal assistance   Toileting- Clothing Manipulation and Hygiene: Minimal assistance;Sit to/from stand Toileting - Clothing Manipulation Details (indicate cue type and reason): assist for gown     Functional mobility during ADLs: Cueing for safety;Minimal assistance(due to R field cut)       Vision Patient Visual Report: No change from baseline Additional Comments: R field cut     Perception     Praxis      Pertinent Vitals/Pain Pain Assessment: No/denies pain     Hand Dominance Right   Extremity/Trunk Assessment Upper Extremity Assessment Upper Extremity Assessment: RUE deficits/detail RUE Deficits / Details: + drift, strength grossly 4/5 RUE Sensation: decreased proprioception RUE Coordination: decreased fine motor;decreased gross motor   Lower Extremity Assessment Lower Extremity Assessment: Defer to PT evaluation RLE Deficits / Details: AROM/strength WFL, but functional deficits noted with mobility   Cervical / Trunk Assessment Cervical / Trunk Assessment: Normal   Communication Communication Communication: Expressive difficulties(dysarthria)   Cognition Arousal/Alertness: Awake/alert Behavior During Therapy: WFL for tasks assessed/performed Overall Cognitive Status: Impaired/Different from baseline Area of Impairment: Orientation;Memory;Safety/judgement;Attention;Awareness                 Orientation Level: Disoriented to;Time;Situation Current Attention Level: Sustained Memory: Decreased short-term memory   Safety/Judgement: Decreased awareness of deficits;Decreased awareness of safety Awareness: Intellectual   General Comments: unaware of deficits  General Comments  reported here due to confusion, then later reported did not know what his min issue was, did know here at [facility]    Exercises     Shoulder Instructions      [place] expects to be discharged to:: Private residence Living Arrangements: Non-relatives/Friends Available Help at Discharge: Available PRN/intermittently;Family Type of Home: House Home Access: Stairs to enter [employer] of Steps: 1   Home Layout: One level     Bathroom Shower/Tub: Occupational psychologist: Standard     Home Equipment: None          Prior Functioning/Environment Level of Independence: Independent        Comments: retired Theme park manager        OT Problem List: Decreased strength;Impaired balance (sitting and/or standing);Impaired vision/perception;Decreased coordination;Decreased cognition;Decreased safety awareness;Decreased knowledge of use of DME or AE;Impaired sensation;Impaired UE functional use      OT Treatment/Interventions: Self-care/ADL training;DME and/or AE instruction;Patient/family education;Balance training;Therapeutic activities;Cognitive remediation/compensation;Visual/perceptual remediation/compensation    OT Goals(Current goals can be found in the care plan section) Acute Rehab OT Goals Patient Stated Goal: to go home OT Goal Formulation: With patient Time For Goal Achievement: 12/08/17 Potential to Achieve Goals: Good ADL Goals Pt Will Perform Grooming: with supervision;standing Pt Will Perform Upper Body Bathing: with supervision;sitting Pt Will Perform Lower Body Bathing: with supervision;sit to/from stand Pt Will Transfer to Toilet: with supervision;ambulating;regular height toilet Pt Will Perform Toileting - Clothing Manipulation and hygiene: with supervision;sit to/from stand Pt Will Perform Tub/Shower Transfer: Shower transfer;with supervision;ambulating Pt/caregiver will Perform Home Exercise Program: With  written HEP provided;With minimal assist;Right Upper extremity(Coordination activities) Additional ADL Goal #1: Pt will located ADL items and visual targets in R hemispace with minimal verbal cues.  OT Frequency: Min 3X/week   Barriers to D/C:            Co-evaluation              AM-PAC PT "6 Clicks" Daily Activity     Outcome Measure Help from another person eating meals?: A Little Help from another person taking care of personal grooming?: A Little Help from another person toileting, which includes using toliet, bedpan, or urinal?: A Little Help from another person bathing (including washing, rinsing, drying)?: A Little Help from another person to put on and taking off regular upper body clothing?: A Little Help from another person to put on and taking off regular lower body clothing?: A Little 6 Click Score: 18   End of Session Equipment Utilized During Treatment: Gait belt  Activity Tolerance: Patient tolerated treatment well Patient left: in chair;with chair alarm set;with call bell/phone within reach  OT Visit Diagnosis: Unsteadiness on feet (R26.81);Other abnormalities of gait and mobility (R26.89);Hemiplegia and hemiparesis;Other symptoms and signs involving cognitive function;Muscle weakness (generalized) (M62.81) Hemiplegia - Right/Left: Right Hemiplegia - dominant/non-dominant: Dominant Hemiplegia - caused by: Cerebral infarction                Time: 6712-4580 OT Time Calculation (min): 28 min Charges:  OT General Charges $OT Visit: 1 Visit OT Evaluation $OT Eval Moderate Complexity: 1 Mod OT Treatments $Self Care/Home Management : 8-22 mins  11/24/2017 Nestor Lewandowsky, OTR/L Pager: [phone]  Werner Lean Haze Boyden 11/24/2017, 3:34 PM

## 2017-11-24 NOTE — H&P (Signed)
History and Physical    James [name] HAL:[id number] DOB: January 01, 1956 DOA: 11/23/2017  PCP: Patient, No Pcp Per   Patient coming from: Home   Chief Complaint: Confusion, difficulty with fine-motor skills, right jaw swelling   HPI: James [name] is a 62 y.o. male who denies any significant past medical history, now presenting to the emergency department for evaluation of confusion, difficulty with fine motor skills, and swelling of the right jaw.  Patient's family has become concerned that he has been increasingly confused and having difficulty with fine motor skills over the past week or more; the patient has not appreciated this but complains of swelling at the angle in the right mandible that is nontender.  Patient denies any recent fall or trauma, denies significant alcohol use, denies substance abuse, and reports that he does not take any medications.  He denies chest pain or palpitations.  He denies history of stroke.  ED Course: Upon arrival to the ED, patient is found to be afebrile, saturating well on room air, and with vitals otherwise stable.  EKG features a sinus rhythm with left axis deviation.  Noncontrast head CT is notable for an age-indeterminate infarct involving the medial left temporal lobe and chronic appearing infarcts in the occipital lobe and left thalamus.  Chemistry panel is notable for a creatinine 1.31 with no priors available for comparison.  CBC features a mild leukocytosis.  Neurology was consulted by the ED physician.  Patient remains hemodynamically stable, in no apparent distress, and will be observed for ongoing evaluation and management.   Review of Systems:  All other systems reviewed and apart from HPI, are negative.  History reviewed. No pertinent past medical history.  History reviewed. No pertinent surgical history.   reports that he has been smoking. He has never used smokeless tobacco. He reports that he does not drink alcohol or use drugs.  No Known  Allergies  History reviewed. No pertinent family history.   Prior to Admission medications   Not on File    Physical Exam: Vitals:   11/24/17 0045 11/24/17 0100 11/24/17 0130 11/24/17 0215  BP: (!) 136/92 136/86 130/83 126/88  Pulse: 84 82 73 79  Resp: 14 14 18    Temp:    98.4 F (36.9 C)  TempSrc:    Oral  SpO2: 94% 96% 95% 95%  Weight:    94.1 kg  Height:          Constitutional: NAD, calm  Eyes: PERTLA, lids and conjunctivae normal ENMT: Mucous membranes are moist. Nontender swelling at angle of mandible on right > left.   Neck: normal, supple, no masses, no thyromegaly Respiratory: clear to auscultation bilaterally, no wheezing, no crackles. Normal respiratory effort.    Cardiovascular: S1 & S2 heard, regular rate and rhythm. No extremity edema.   Abdomen: No distension, no tenderness, soft. Bowel sounds normal.  Musculoskeletal: no clubbing / cyanosis. No joint deformity upper and lower extremities.    Skin: no significant rashes, lesions, ulcers. Warm, dry, well-perfused. Neurologic: No gross facial asymmetry. Mild dysarthria. Sensation to light touch intact. Strength 5/5 in all 4 limbs.  Psychiatric: Somnolent, oriented to person, place, and situation. Calm and cooperative.     Labs on Admission: I have personally reviewed following labs and imaging studies  CBC: Recent Labs  Lab 11/23/17 2112  WBC 11.6*  HGB 17.0  HCT 49.8  MCV 91.9  PLT 735   Basic Metabolic Panel: Recent Labs  Lab 11/23/17 2112  NA 139  K  3.8  CL 106  CO2 21*  GLUCOSE 130*  BUN 13  CREATININE 1.31*  CALCIUM 9.2   GFR: Estimated Creatinine Clearance: 67.8 mL/min (A) (by C-G formula based on SCr of 1.31 mg/dL (H)). Liver Function Tests: Recent Labs  Lab 11/23/17 2112  AST 27  ALT 49*  ALKPHOS 94  BILITOT 1.0  PROT 7.1  ALBUMIN 4.0   No results for input(s): LIPASE, AMYLASE in the last 168 hours. No results for input(s): AMMONIA in the last 168 hours. Coagulation  Profile: No results for input(s): INR, PROTIME in the last 168 hours. Cardiac Enzymes: No results for input(s): CKTOTAL, CKMB, CKMBINDEX, TROPONINI in the last 168 hours. BNP (last 3 results) No results for input(s): PROBNP in the last 8760 hours. HbA1C: No results for input(s): HGBA1C in the last 72 hours. CBG: Recent Labs  Lab 11/23/17 2115  GLUCAP 120*   Lipid Profile: No results for input(s): CHOL, HDL, LDLCALC, TRIG, CHOLHDL, LDLDIRECT in the last 72 hours. Thyroid Function Tests: No results for input(s): TSH, T4TOTAL, FREET4, T3FREE, THYROIDAB in the last 72 hours. Anemia Panel: No results for input(s): VITAMINB12, FOLATE, FERRITIN, TIBC, IRON, RETICCTPCT in the last 72 hours. Urine analysis: No results found for: COLORURINE, APPEARANCEUR, LABSPEC, PHURINE, GLUCOSEU, HGBUR, BILIRUBINUR, KETONESUR, PROTEINUR, UROBILINOGEN, NITRITE, LEUKOCYTESUR Sepsis Labs: @LABRCNTIP (procalcitonin:4,lacticidven:4) )No results found for this or any previous visit (from the past 240 hour(s)).   Radiological Exams on Admission: Ct Head Wo Contrast  Result Date: 11/23/2017 CLINICAL DATA:  Ataxia EXAM: CT HEAD WITHOUT CONTRAST TECHNIQUE: Contiguous axial images were obtained from the base of the skull through the vertex without intravenous contrast. COMPARISON:  None. FINDINGS: Brain: No hemorrhage or intracranial mass is visualized. Mild atrophy. Mild small vessel ischemic changes of the white matter. Probable chronic left thalamic infarct and small suspected chronic left occipital infarct. Age indeterminate infarct within the medial left temporal lobe. Ventricles are nonenlarged. Vascular: No hyperdense vessels. Vertebral artery and carotid vascular calcification Skull: No fracture Sinuses/Orbits: Mucosal thickening in the ethmoid and maxillary sinuses. Other: None IMPRESSION: 1. Age indeterminate infarct within the medial left temporal lobe. Small infarcts in the left occipital lobe and left thalamus,  probably chronic. 2. Atrophy with mild small vessel ischemic changes of the white matter. Electronically Signed   By: Donavan Foil M.D.   On: 11/23/2017 22:48    EKG: Independently reviewed. Sinus rhythm, LAD.   Assessment/Plan   1. Acute encephalopathy; age-indeterminate CVA   - Presents for evaluation of confusion and difficulty with fine motor skills noted by family to have been worsening over the past week or so  - Head CT with age-indeterminate infarct involving medial left temporal lobe   - Neurology consulting and much appreciated, will follow-up on recs  - Continue cardiac monitoring and neuro checks, check MRI brain, MRA head, echocardiogram, carotid US, fasting lipids, A1c, start ASA    2. History of CVA   - Chronic-appearing infarcts noted on CT involving left occipital lobe and left thalamus    - Eval and treatment as above    3. Mild renal insufficiency  - SCr is 1.31 on admission, no prior labs available for comparison, unknown chronicity  - Hydrate with IVF overnight, repeat chem panel in am    4. Parotid swelling  - Patient recently noted nontender swelling at angle of mandible on the right  - Consider CT with contrast, but with renal insufficiency of uncertain chronicity, will hold-off for now and can likely be worked-up in  outpatient setting     DVT prophylaxis: Lovenox Code Status: Full  Family Communication: Discussed with patient  Consults called: Neurology Admission status: Observation     Vianne Bulls, MD [facility] Pager [phone]  If 7PM-7AM, please contact night-coverage [email or web address] Password [email or web address]  11/24/2017, 3:31 AM

## 2017-11-24 NOTE — Progress Notes (Signed)
Carotid duplex prelim: 1-39% ICA stenosis.  Jabir Dahlem Eunice, RDMS, RVT   

## 2017-11-24 NOTE — Evaluation (Addendum)
Physical Therapy Evaluation Patient Details Name: James [name] MRN: [id number] DOB: 26-May-1955 Today's Date: 11/24/2017   History of Present Illness  James [name] is a 62 y.o. male who denies any significant past medical history, now presenting to the emergency department for evaluation of confusion, difficulty with fine motor skills, and swelling of the right jaw.  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  Also with R field cut and decreased awareness limiting safety with obstacle negotiation.  Feel he will benefit from follow up CIR level rehab prior to d/c home due to visual and cognitive deficits.  Will trial cane next session to see if increases safety.      Follow Up Recommendations Supervision/Assistance - 24 hour;CIR(initial 24 hour assist)    Equipment Recommendations  Other (comment)(TBA ? cane)    Recommendations for Other Services Rehab consult     Precautions / Restrictions Precautions Precautions: Fall Precaution Comments: R field cut      Mobility  Bed Mobility Overal bed mobility: Needs Assistance Bed Mobility: Supine to Sit     Supine to sit: Supervision     General bed mobility comments: pt in chair  Transfers Overall transfer level: Needs assistance Equipment used: None Transfers: Sit to/from [employer] Transfers Sit to Stand: Supervision Stand pivot transfers: Min guard       General transfer comment: able to stand unaided, but some evidence for imbalance, assist to pivot to recliner due to IV and R LE weakness  Ambulation/Gait Ambulation/Gait assistance: Min guard;Min assist Gait Distance (Feet): 200 Feet Assistive device: None Gait Pattern/deviations: Step-through pattern;Drifts right/left;Staggering right     General Gait Details: evidence for  imbalance with R LE buckling at him occasionally with cross steps; able to perform part of DGI with deficits noted.  noted close to obstacles, door facing and some cueing in room to maneuver obstacles on R side safely  Stairs            Wheelchair Mobility    Modified Rankin (Stroke Patients Only) Modified Rankin (Stroke Patients Only) Pre-Morbid Rankin Score: Slight disability Modified Rankin: Moderately severe disability     Balance Overall balance assessment: Needs assistance   Sitting balance-Leahy Scale: Good       Standing balance-Leahy Scale: Fair Standing balance comment: stands unsupported without LOB                 Standardized Balance Assessment Standardized Balance Assessment : Dynamic Gait Index   Dynamic Gait Index Level Surface: Mild Impairment Change in Gait Speed: Moderate Impairment Gait with Horizontal Head Turns: Normal Gait with Vertical Head Turns: Moderate Impairment Gait and Pivot Turn: Moderate Impairment       Pertinent Vitals/Pain Pain Assessment: No/denies pain    Home Living Family/patient expects to be discharged to:: Private residence Living Arrangements: Non-relatives/Friends Available Help at Discharge: Available PRN/intermittently;Family Type of Home: House Home Access: Stairs to enter   Technical brewer of Steps: 1 Home Layout: One level Home Equipment: None      Prior Function Level of Independence: Independent         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 Dominance   Dominant Hand: Right    Extremity/Trunk Assessment   Upper Extremity Assessment Upper Extremity Assessment: RUE deficits/detail RUE Deficits / Details: + drift, strength grossly 4/5 RUE Sensation: decreased proprioception RUE Coordination:  decreased fine motor;decreased gross motor    Lower Extremity Assessment Lower Extremity Assessment: Defer to PT evaluation RLE Deficits / Details: AROM/strength WFL, but functional  deficits noted with mobility    Cervical / Trunk Assessment Cervical / Trunk Assessment: Normal  Communication   Communication: Expressive difficulties(dysarthria)  Cognition Arousal/Alertness: Awake/alert Behavior During Therapy: WFL for tasks assessed/performed Overall Cognitive Status: Impaired/Different from baseline Area of Impairment: Orientation;Memory;Safety/judgement;Attention;Awareness                 Orientation Level: Disoriented to;Time;Situation Current Attention Level: Sustained Memory: Decreased short-term memory   Safety/Judgement: Decreased awareness of deficits;Decreased awareness of safety Awareness: Intellectual   General Comments: unaware of deficits      General Comments General comments (skin integrity, edema, etc.): reported here due to confusion, then later reported did not know what his min issue was, did know here at [facility]    Exercises     Assessment/Plan    PT Assessment Patient needs continued PT services  PT Problem List Decreased strength;Decreased safety awareness;Decreased mobility;Decreased coordination;Decreased activity tolerance;Impaired sensation;Decreased balance;Decreased cognition;Decreased knowledge of use of DME       PT Treatment Interventions DME instruction;Therapeutic activities;Patient/family education;Therapeutic exercise;Gait training;Balance training;Stair training;Functional mobility training    PT Goals (Current goals can be found in the Care Plan section)  Acute Rehab PT Goals Patient Stated Goal: to go home PT Goal Formulation: With patient Time For Goal Achievement: 11/27/17 Potential to Achieve Goals: Good Additional Goals Additional Goal #1: Complete DGI and set goal as appropriate for fall prevention.    Frequency Min 4X/week   Barriers to discharge        Co-evaluation               AM-PAC PT "6 Clicks" Daily Activity  Outcome Measure Difficulty turning over in bed (including adjusting  bedclothes, sheets and blankets)?: A Little Difficulty moving from lying on back to sitting on the side of the bed? : A Little Difficulty sitting down on and standing up from a chair with arms (e.g., wheelchair, bedside commode, etc,.)?: A Little Help needed moving to and from a bed to chair (including a wheelchair)?: A Little Help needed walking in hospital room?: A Little Help needed climbing 3-5 steps with a railing? : A Little 6 Click Score: 18    End of Session Equipment Utilized During Treatment: Gait belt Activity Tolerance: Patient tolerated treatment well Patient left: with call bell/phone within reach;in chair;with chair alarm set   PT Visit Diagnosis: Other abnormalities of gait and mobility (R26.89);Other symptoms and signs involving the nervous system (R29.898)    Time: 8315-1761 PT Time Calculation (min) (ACUTE ONLY): 23 min   Charges:   PT Evaluation $PT Eval Moderate Complexity: 1 Mod PT Treatments $Gait Training: 8-22 mins        [place], [place] [phone] 11/24/2017   Reginia Naas 11/24/2017, 5:10 PM

## 2017-11-24 NOTE — Plan of Care (Signed)
POC reviewed with patient. NIHSS completed as ordered. Pt down to MRI. No acute events throughout shift, no changes noted.

## 2017-11-25 ENCOUNTER — Encounter (HOSPITAL_COMMUNITY): Payer: Self-pay | Admitting: Physician Assistant

## 2017-11-25 ENCOUNTER — Encounter (HOSPITAL_COMMUNITY)
Admission: EM | Discharge status: Against Medical Advice | Payer: Self-pay | Source: Home / Self Care | Attending: Internal Medicine

## 2017-11-25 ENCOUNTER — Inpatient Hospital Stay (HOSPITAL_COMMUNITY): Payer: Self-pay

## 2017-11-25 ENCOUNTER — Other Ambulatory Visit: Payer: Self-pay | Admitting: Physician Assistant

## 2017-11-25 DIAGNOSIS — I63039 Cerebral infarction due to thrombosis of unspecified carotid artery: Secondary | ICD-10-CM

## 2017-11-25 DIAGNOSIS — Q211 Atrial septal defect: Secondary | ICD-10-CM

## 2017-11-25 HISTORY — PX: TEE WITHOUT CARDIOVERSION: SHX5443

## 2017-11-25 SURGERY — ECHOCARDIOGRAM, TRANSESOPHAGEAL
Anesthesia: Moderate Sedation

## 2017-11-25 MED ORDER — ATORVASTATIN CALCIUM 80 MG PO TABS: 80.0000 mg | ORAL_TABLET | Freq: Every day | ORAL | 0 refills | Status: DC

## 2017-11-25 MED ORDER — FENTANYL CITRATE (PF) 100 MCG/2ML IJ SOLN
INTRAMUSCULAR | Status: AC
  Filled 2017-11-25: qty 2

## 2017-11-25 MED ORDER — FENTANYL CITRATE (PF) 100 MCG/2ML IJ SOLN
INTRAMUSCULAR | Status: DC | PRN
  Administered 2017-11-25: 25 ug via INTRAVENOUS

## 2017-11-25 MED ORDER — SODIUM CHLORIDE 0.9 % IV SOLN
INTRAVENOUS | Status: DC
  Administered 2017-11-25: 500 mL via INTRAVENOUS

## 2017-11-25 MED ORDER — ASPIRIN 325 MG PO TABS: 325.0000 mg | ORAL_TABLET | Freq: Every day | ORAL | 0 refills | Status: DC

## 2017-11-25 MED ORDER — BUTAMBEN-TETRACAINE-BENZOCAINE 2-2-14 % EX AERO
INHALATION_SPRAY | CUTANEOUS | Status: DC | PRN
  Administered 2017-11-25: 2 via TOPICAL

## 2017-11-25 MED ORDER — MIDAZOLAM HCL 5 MG/ML IJ SOLN
INTRAMUSCULAR | Status: AC
  Filled 2017-11-25: qty 2

## 2017-11-25 MED ORDER — MIDAZOLAM HCL 10 MG/2ML IJ SOLN
INTRAMUSCULAR | Status: DC | PRN
  Administered 2017-11-25 (×2): 2 mg via INTRAVENOUS

## 2017-11-25 NOTE — Progress Notes (Signed)
OT Cancellation Note    11/25/17 1400  OT Visit Information  Last OT Received On 11/25/17  Reason Eval/Treat Not Completed Patient at procedure or test/ unavailable (TEE)  Maurie Boettcher, OT/L  OT Clinical Specialist [phone]

## 2017-11-25 NOTE — Progress Notes (Signed)
Physical Therapy Treatment Patient Details Name: James [name] MRN: [id number] DOB: March 28, 1955 Today's Date: 11/25/2017    History of Present Illness James [name] is a 62 y.o. male who denies any significant past medical history, now presenting to the emergency department for evaluation of confusion, difficulty with fine motor skills, and swelling of the right jaw.  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 Has significantly decreased insight into his deficits and situation as well as displays decreased attention, awareness, and problem solving abilities. Currently requiring up to moderate assistance for hallway ambulation, running into objects on his right side at least 5 times and maximal directional cueing provided for safety. Gait speed of 1.73 ft/s indicating patient is at high risk of falling as well as his 5 Times Sit to Stand assessment of 16.35 seconds (cut off score of 12 seconds). Continue to recommend comprehensive inpatient rehab (CIR) for post-acute therapy needs. Patient may refuse and if so, recommend 24/7 supervision/assistance and OP neuro PT services.    Follow Up Recommendations  Supervision/Assistance - 24 hour;CIR     Equipment Recommendations  None recommended by PT    Recommendations for Other Services Rehab consult     Precautions / Restrictions Precautions Precautions: Fall Precaution Comments: R homonymous hemianopsia Restrictions Weight Bearing Restrictions: No    Mobility  Bed Mobility               General bed mobility comments: pt in chair  Transfers Overall transfer level: Needs assistance Equipment used: None Transfers: Sit to/from Stand Sit to Stand: Supervision            Ambulation/Gait Ambulation/Gait assistance: Mod assist Gait Distance (Feet): 200  Feet Assistive device: None Gait Pattern/deviations: Step-through pattern;Drifts right/left;Staggering right Gait velocity: 1.73 ft/s Gait velocity interpretation: <1.8 ft/sec, indicate of risk for recurrent falls General Gait Details: pt with poor balance reactions, displaying cross over stepping with obstacle negotiation or head turns requiring moderate assistance to maintain upright. poor attention to right side, running into objects ~5 times on the right side with no awareness. cueing provided for head turns and needed frequent directional cueing for maintaining safety   Stairs             Wheelchair Mobility    Modified Rankin (Stroke Patients Only) Modified Rankin (Stroke Patients Only) Pre-Morbid Rankin Score: Slight disability Modified Rankin: Moderately severe disability     Balance Overall balance assessment: Needs assistance   Sitting balance-Leahy Scale: Good       Standing balance-Leahy Scale: Fair                              Cognition Arousal/Alertness: Awake/alert Behavior During Therapy: Agitated;Impulsive Overall Cognitive Status: Impaired/Different from baseline Area of Impairment: Orientation;Memory;Safety/judgement;Attention;Awareness                 Orientation Level: Disoriented to;Time;Situation Current Attention Level: Sustained Memory: Decreased short-term memory   Safety/Judgement: Decreased awareness of deficits;Decreased awareness of safety Awareness: Intellectual   General Comments: unaware of deficits, poor short term memory in which he does not remember having a stroke, why he is in the hospital, or therapy yesterday      Exercises      General Comments        Pertinent Vitals/Pain  Pain Assessment: No/denies pain    Home Living Family/patient expects to be discharged to:: Private residence   Available Help at Discharge: Available PRN/intermittently;Family Type of Home: House              Prior  Function            PT Goals (current goals can now be found in the care plan section) Acute Rehab PT Goals Potential to Achieve Goals: Good Progress towards PT goals: Progressing toward goals    Frequency    Min 4X/week      PT Plan Current plan remains appropriate    Co-evaluation              AM-PAC PT "6 Clicks" Daily Activity  Outcome Measure  Difficulty turning over in bed (including adjusting bedclothes, sheets and blankets)?: None Difficulty moving from lying on back to sitting on the side of the bed? : None Difficulty sitting down on and standing up from a chair with arms (e.g., wheelchair, bedside commode, etc,.)?: A Little Help needed moving to and from a bed to chair (including a wheelchair)?: A Little Help needed walking in hospital room?: A Lot Help needed climbing 3-5 steps with a railing? : A Lot 6 Click Score: 18    End of Session Equipment Utilized During Treatment: Gait belt Activity Tolerance: Patient tolerated treatment well Patient left: in chair;with call bell/phone within reach Nurse Communication: Mobility status PT Visit Diagnosis: Other abnormalities of gait and mobility (R26.89);Other symptoms and signs involving the nervous system (R29.898)     Time: 5465-0354 PT Time Calculation (min) (ACUTE ONLY): 24 min  Charges:  $Gait Training: 8-22 mins $Therapeutic Activity: 8-22 mins                    Ellamae Sia, PT, DPT Acute Rehabilitation Services  Pager: [phone]    Willy Eddy 11/25/2017, 10:33 AM

## 2017-11-25 NOTE — Consult Note (Addendum)
ELECTROPHYSIOLOGY CONSULT NOTE  Patient ID: James [name] MRN: [id number], DOB/AGE: [birth year]   Admit date: 11/23/2017 Date of Consult: 11/25/2017  Primary Physician: Patient, No Pcp Per Primary Cardiologist: none Reason for Consultation: Cryptogenic stroke ; recommendations regarding Implantable Loop Recorder, requested by Dr. Leonie Man  History of Present Illness Goerge Mohr was admitted on 11/23/2017 with confusion, difficult speech, dx with acute CVA.  He first developed symptoms over a period of weeks per the chart.  Imaging demonstrated Patchy L PCA infarcts involving the L occipital, splenium and thalamus, infarct embolic secondary to unknown source.  he has undergone workup for stroke including echocardiogram and carotid dopplers prelim reported no significant disease.  The patient has been monitored on telemetry which has demonstrated sinus rhythm with no atrial arrhythmias.  Inpatient stroke work-up is to be completed with a TEE.   Echocardiogram this admission demonstrated  Study Conclusions - Left ventricle: The cavity size was normal. Wall thickness was   increased in a pattern of moderate LVH. Systolic function was   mildly to moderately reduced. The estimated ejection fraction was   in the range of 40% to 45%. Akinesis of the basalinferior   myocardium. Doppler parameters are consistent with abnormal left   ventricular relaxation (grade 1 diastolic dysfunction).  Lab work is reviewed.  Prior to admission, the patient denies chest pain, shortness of breath, dizziness, palpitations, or syncope.  They are recovering from their stroke with plans to CIR at discharge.  Past Medical History:  Diagnosis Date  . Heart disease    patient reports 2 stents ini his heart "years ago"     Surgical History: History reviewed. No pertinent surgical history.   No medications prior to admission.    Inpatient Medications:  . aspirin  325 mg Oral Daily  . atorvastatin  80 mg Oral  q1800  . enoxaparin (LOVENOX) injection  40 mg Subcutaneous Q24H  . sodium chloride flush  3 mL Intravenous Q12H    Allergies: No Known Allergies  Social History   Socioeconomic History  . Marital status: Single    Spouse name: Not on file  . Number of children: Not on file  . Years of education: Not on file  . Highest education level: Not on file  Occupational History  . Not on file  Social Needs  . Financial resource strain: Not on file  . Food insecurity:    Worry: Not on file    Inability: Not on file  . Transportation needs:    Medical: Not on file    Non-medical: Not on file  Tobacco Use  . Smoking status: Current Some Day Smoker  . Smokeless tobacco: Never Used  Substance and Sexual Activity  . Alcohol use: Never    Frequency: Never  . Drug use: Never  . Sexual activity: Not on file  Lifestyle  . Physical activity:    Days per week: Not on file    Minutes per session: Not on file  . Stress: Not on file  Relationships  . Social connections:    Talks on phone: Not on file    Gets together: Not on file    Attends religious service: Not on file    Active member of club or organization: Not on file    Attends meetings of clubs or organizations: Not on file    Relationship status: Not on file  . Intimate partner violence:    Fear of current or ex partner: Not on file  Emotionally abused: Not on file    Physically abused: Not on file    Forced sexual activity: Not on file  Other Topics Concern  . Not on file  Social History Narrative  . Not on file     History reviewed. No pertinent family history. The patient denies to me any known family medical history  Review of Systems: All other systems reviewed and are otherwise negative except as noted above.  Physical Exam: Vitals:   11/24/17 1009 11/24/17 1236 11/24/17 2215 11/25/17 0606  BP: (!) 121/93 133/88 129/90 (!) 135/92  Pulse: 86 96 73 80  Resp:  18 17 17   Temp:  98.9 F (37.2 C) 98.2 F (36.8  C) 97.7 F (36.5 C)  TempSrc:   Oral Oral  SpO2: 95% 98% 96% 100%  Weight:      Height:        GEN- The patient is well appearing, alert and oriented to self only.   Head- normocephalic, atraumatic Eyes-  Sclera clear, conjunctiva pink Ears- hearing intact Oropharynx- clear Neck- supple Lungs- CTA b/l, normal work of breathing Heart- RRR, no murmurs, rubs or gallops  GI- soft, NT, ND Extremities- no clubbing, cyanosis, or edema MS- no significant deformity or atrophy Skin- no rash or lesion Psych- euthymic mood, full affect   Labs:   Lab Results  Component Value Date   WBC 11.6 (H) 11/23/2017   HGB 17.0 11/23/2017   HCT 49.8 11/23/2017   MCV 91.9 11/23/2017   PLT 245 11/23/2017    Recent Labs  Lab 11/23/17 2112 11/24/17 0351  NA 139 140  K 3.8 3.7  CL 106 107  CO2 21* 23  BUN 13 16  CREATININE 1.31* 1.04  CALCIUM 9.2 8.9  PROT 7.1  --   BILITOT 1.0  --   ALKPHOS 94  --   ALT 49*  --   AST 27  --   GLUCOSE 130* 93   No results found for: CKTOTAL, CKMB, CKMBINDEX, TROPONINI Lab Results  Component Value Date   CHOL 204 (H) 11/24/2017   Lab Results  Component Value Date   HDL 34 (L) 11/24/2017   Lab Results  Component Value Date   LDLCALC 143 (H) 11/24/2017   Lab Results  Component Value Date   TRIG 135 11/24/2017   Lab Results  Component Value Date   CHOLHDL 6.0 11/24/2017   No results found for: LDLDIRECT  No results found for: DDIMER   Radiology/Studies:   Ct Head Wo Contrast Result Date: 11/23/2017 CLINICAL DATA:  Ataxia EXAM: CT HEAD WITHOUT CONTRAST TECHNIQUE: Contiguous axial images were obtained from the base of the skull through the vertex without intravenous contrast. COMPARISON:  None. FINDINGS: Brain: No hemorrhage or intracranial mass is visualized. Mild atrophy. Mild small vessel ischemic changes of the white matter. Probable chronic left thalamic infarct and small suspected chronic left occipital infarct. Age indeterminate  infarct within the medial left temporal lobe. Ventricles are nonenlarged. Vascular: No hyperdense vessels. Vertebral artery and carotid vascular calcification Skull: No fracture Sinuses/Orbits: Mucosal thickening in the ethmoid and maxillary sinuses. Other: None IMPRESSION: 1. Age indeterminate infarct within the medial left temporal lobe. Small infarcts in the left occipital lobe and left thalamus, probably chronic. 2. Atrophy with mild small vessel ischemic changes of the white matter. Electronically Signed   By: Donavan Foil M.D.   On: 11/23/2017 22:48    Mr Jodene Nam Head Wo Contrast Result Date: 11/24/2017 CLINICAL DATA:  Initial evaluation for  acute altered mental status. EXAM: MRI HEAD WITHOUT CONTRAST MRA HEAD WITHOUT CONTRAST TECHNIQUE: Multiplanar, multiecho pulse sequences of the brain and surrounding structures were obtained without intravenous contrast. Angiographic images of the head were obtained using MRA technique without contrast. COMPARISON:  Comparison made with prior CT from 11/23/2017 FINDINGS: MRI HEAD FINDINGS Brain: Diffuse prominence of the CSF containing spaces compatible with generalized cerebral atrophy. Patchy and confluent T2/FLAIR hyperintensity within the periventricular deep white matter both cerebral hemispheres most compatible with chronic small vessel ischemic disease, moderate nature. Chronic microvascular ischemic changes present within the pons. Patchy multifocal areas of restricted diffusion seen involving the parasagittal left temporal and occipital lobes, left thalamus, and left splenium, consistent with acute left PCA territory infarcts. Slight extension into the left cerebral peduncle and left midbrain. No associated hemorrhage or mass effect. No other evidence for acute or subacute ischemia. Gray-white matter differentiation otherwise maintained. No evidence for acute or chronic intracranial hemorrhage. No mass lesion, midline shift or mass effect. No hydrocephalus. No  extra-axial fluid collection. Pituitary gland normal. Vascular: Major intracranial vascular flow voids maintained. Skull and upper cervical spine: Craniocervical junction within normal limits. Bone marrow signal intensity somewhat diffusely decreased on T1 weighted imaging, most commonly related to anemia, smoking, or obesity. No focal marrow replacing lesion. No scalp soft tissue abnormality. Sinuses/Orbits: Globes and orbital soft tissues within normal limits. Scattered mucosal thickening throughout the paranasal sinuses. Superimposed left maxillary sinus retention cyst noted. Trace opacity left mastoid air cells. Other: 3.4 cm lobulated well-circumscribed T2 hypointense lesion within the right parotid gland (series 19, image 17). Additional probable lesion within the left parotid gland measures 13 mm (series 19, image 17). MRA HEAD FINDINGS ANTERIOR CIRCULATION: Examination mildly degraded by motion artifact. Distal cervical segments of the internal carotid arteries are widely patent with antegrade flow. Petrous segments patent bilaterally. Mild atheromatous irregularity within the cavernous/supraclinoid ICAs without hemodynamically significant stenosis. A1 segments, anterior communicating artery common anterior cerebral arteries widely patent bilaterally. M1 segments patent without stenosis. No proximal M2 occlusion. Distal MCA branches well perfused and symmetric. Mild distal small vessel atheromatous irregularity. POSTERIOR CIRCULATION: Vertebral arteries code dominant and patent to the vertebrobasilar junction without stenosis. Posterior inferior cerebral arteries patent proximally. Basilar widely patent to its distal aspect. Superior cerebral arteries patent bilaterally. Both of the posterior cerebral artery supplied via the basilar. Right PCA widely patent to its distal aspect. There is abrupt occlusion of the proximal left PCA, left P2 segment. No intracranial aneurysm. IMPRESSION: MRI HEAD IMPRESSION: 1.  Patchy multifocal left PCA territory infarcts involving the parasagittal left temporal occipital region, left thalamus, and left splenium. No associated hemorrhage or mass effect. 2. Atrophy with moderate chronic microvascular ischemic disease. 3. 3.4 cm mass within the right parotid gland, with additional probable 13 mm left parotid lesion. Findings are nonspecific. Follow-up with nonemergent ENT consultation and referral on an outpatient basis could be performed for further evaluation as clinically warranted. MRA HEAD IMPRESSION: 1. Acute proximal left P2 occlusion. 2. Major intracranial arterial vasculature otherwise widely patent with no hemodynamically significant or correctable stenosis identified. 3. Mild distal small vessel atheromatous irregularity. Electronically Signed   By: Jeannine Boga M.D.   On: 11/24/2017 06:20    12-lead ECG SR All prior EKG's in EP[facility] reviewed with no documented atrial fibrillation  Telemetry SR, infrequent PVCs, rare couplet, one NSVT 5 beats  Assessment and Plan:  1. Cryptogenic stroke The patient presents with cryptogenic stroke.  The patient has a TEE planned  for this afternoon.  The patient is not completely oriented, surprised to be told he had a stroke, he was able to tell me his name,birthday, and that he was in a hospital, though not sure why, inaccurate to year, "I think it is 2020" and could not tell me who the president was.  There is no family at bedside.  I am told there is a out of state sister that staff has been communication with.  I discussed with the patient about monitoring for afib with either a 30 day event monitor or an implantable loop recorder, as well as risks, benefits, and alteratives to implantable loop recorder were discussed with the patient today, though given his level of orientation, not reasonably certain his retention of the information.   In d/w RN, current level of orientation is how he has been since admission, she will look  into if there is a POA.  I am not sure timing is right for loop implant have reviewed the case with Dr. Rayann Heman, at this juncture, recommend 30 day monitoring to start and will make out patient follow up to assess his progress and these findings.    Baldwin Jamaica, PA-C 11/25/2017   I have reviewed the above assessment and plan.  Changes to above are made where necessary.  The patient remains confused.  Not currently a candidate for remote monitoring.  I would advise 30 day monitor at discharge to evaluate for atrial arrhythmias.  Follow-up with EP PA in 2 months.  If no arrhythmias found and patient makes significant cognitive improvement, we could consider long term monitoring at that time.  [facility] will sign off.   Medication Recommendations:  No changes Other recommendations (labs, testing, etc):  30 day monitor as above Follow up as an outpatient:  Follow-up with EP PA in 2 months   Co Sign: Thompson Grayer, MD 11/25/2017 4:57 PM

## 2017-11-25 NOTE — Progress Notes (Signed)
STROKE TEAM PROGRESS NOTE      INTERVAL HISTORY His significant other is at the bedside.   Patient initially had refused TEE after live a long discussion with him with regards his stroke risk and the need for inappropriate testing for stroke risk stratification he is agreeable.  Vitals:   11/25/17 1435 11/25/17 1440 11/25/17 1445 11/25/17 1450  BP: (!) 153/91 126/85 126/72 126/84  Pulse: 69 83 80 79  Resp: 17 15 17 16   Temp:      TempSrc:      SpO2: 100% 99% 99% 99%  Weight:      Height:        CBC:  Recent Labs  Lab 11/23/17 2112  WBC 11.6*  HGB 17.0  HCT 49.8  MCV 91.9  PLT 366    Basic Metabolic Panel:  Recent Labs  Lab 11/23/17 2112 11/24/17 0351  NA 139 140  K 3.8 3.7  CL 106 107  CO2 21* 23  GLUCOSE 130* 93  BUN 13 16  CREATININE 1.31* 1.04  CALCIUM 9.2 8.9   Lipid Panel:     Component Value Date/Time   CHOL 204 (H) 11/24/2017 0351   TRIG 135 11/24/2017 0351   HDL 34 (L) 11/24/2017 0351   CHOLHDL 6.0 11/24/2017 0351   VLDL 27 11/24/2017 0351   LDLCALC 143 (H) 11/24/2017 0351   HgbA1c:  Lab Results  Component Value Date   HGBA1C 5.8 (H) 11/24/2017   Urine Drug Screen:     Component Value Date/Time   LABOPIA NONE DETECTED 11/24/2017 0644   COCAINSCRNUR NONE DETECTED 11/24/2017 0644   LABBENZ NONE DETECTED 11/24/2017 0644   AMPHETMU NONE DETECTED 11/24/2017 0644   THCU NONE DETECTED 11/24/2017 0644   LABBARB NONE DETECTED 11/24/2017 0644    Alcohol Level No results found for: ETH  IMAGING Ct Head Wo Contrast  Result Date: 11/23/2017 CLINICAL DATA:  Ataxia EXAM: CT HEAD WITHOUT CONTRAST TECHNIQUE: Contiguous axial images were obtained from the base of the skull through the vertex without intravenous contrast. COMPARISON:  None. FINDINGS: Brain: No hemorrhage or intracranial mass is visualized. Mild atrophy. Mild small vessel ischemic changes of the white matter. Probable chronic left thalamic infarct and small suspected chronic left occipital  infarct. Age indeterminate infarct within the medial left temporal lobe. Ventricles are nonenlarged. Vascular: No hyperdense vessels. Vertebral artery and carotid vascular calcification Skull: No fracture Sinuses/Orbits: Mucosal thickening in the ethmoid and maxillary sinuses. Other: None IMPRESSION: 1. Age indeterminate infarct within the medial left temporal lobe. Small infarcts in the left occipital lobe and left thalamus, probably chronic. 2. Atrophy with mild small vessel ischemic changes of the white matter. Electronically Signed   By: Donavan Foil M.D.   On: 11/23/2017 22:48   Mr Jodene Nam Head Wo Contrast  Result Date: 11/24/2017 CLINICAL DATA:  Initial evaluation for acute altered mental status. EXAM: MRI HEAD WITHOUT CONTRAST MRA HEAD WITHOUT CONTRAST TECHNIQUE: Multiplanar, multiecho pulse sequences of the brain and surrounding structures were obtained without intravenous contrast. Angiographic images of the head were obtained using MRA technique without contrast. COMPARISON:  Comparison made with prior CT from 11/23/2017 FINDINGS: MRI HEAD FINDINGS Brain: Diffuse prominence of the CSF containing spaces compatible with generalized cerebral atrophy. Patchy and confluent T2/FLAIR hyperintensity within the periventricular deep white matter both cerebral hemispheres most compatible with chronic small vessel ischemic disease, moderate nature. Chronic microvascular ischemic changes present within the pons. Patchy multifocal areas of restricted diffusion seen involving the parasagittal left temporal  and occipital lobes, left thalamus, and left splenium, consistent with acute left PCA territory infarcts. Slight extension into the left cerebral peduncle and left midbrain. No associated hemorrhage or mass effect. No other evidence for acute or subacute ischemia. Gray-white matter differentiation otherwise maintained. No evidence for acute or chronic intracranial hemorrhage. No mass lesion, midline shift or mass  effect. No hydrocephalus. No extra-axial fluid collection. Pituitary gland normal. Vascular: Major intracranial vascular flow voids maintained. Skull and upper cervical spine: Craniocervical junction within normal limits. Bone marrow signal intensity somewhat diffusely decreased on T1 weighted imaging, most commonly related to anemia, smoking, or obesity. No focal marrow replacing lesion. No scalp soft tissue abnormality. Sinuses/Orbits: Globes and orbital soft tissues within normal limits. Scattered mucosal thickening throughout the paranasal sinuses. Superimposed left maxillary sinus retention cyst noted. Trace opacity left mastoid air cells. Other: 3.4 cm lobulated well-circumscribed T2 hypointense lesion within the right parotid gland (series 19, image 17). Additional probable lesion within the left parotid gland measures 13 mm (series 19, image 17). MRA HEAD FINDINGS ANTERIOR CIRCULATION: Examination mildly degraded by motion artifact. Distal cervical segments of the internal carotid arteries are widely patent with antegrade flow. Petrous segments patent bilaterally. Mild atheromatous irregularity within the cavernous/supraclinoid ICAs without hemodynamically significant stenosis. A1 segments, anterior communicating artery common anterior cerebral arteries widely patent bilaterally. M1 segments patent without stenosis. No proximal M2 occlusion. Distal MCA branches well perfused and symmetric. Mild distal small vessel atheromatous irregularity. POSTERIOR CIRCULATION: Vertebral arteries code dominant and patent to the vertebrobasilar junction without stenosis. Posterior inferior cerebral arteries patent proximally. Basilar widely patent to its distal aspect. Superior cerebral arteries patent bilaterally. Both of the posterior cerebral artery supplied via the basilar. Right PCA widely patent to its distal aspect. There is abrupt occlusion of the proximal left PCA, left P2 segment. No intracranial aneurysm.  IMPRESSION: MRI HEAD IMPRESSION: 1. Patchy multifocal left PCA territory infarcts involving the parasagittal left temporal occipital region, left thalamus, and left splenium. No associated hemorrhage or mass effect. 2. Atrophy with moderate chronic microvascular ischemic disease. 3. 3.4 cm mass within the right parotid gland, with additional probable 13 mm left parotid lesion. Findings are nonspecific. Follow-up with nonemergent ENT consultation and referral on an outpatient basis could be performed for further evaluation as clinically warranted. MRA HEAD IMPRESSION: 1. Acute proximal left P2 occlusion. 2. Major intracranial arterial vasculature otherwise widely patent with no hemodynamically significant or correctable stenosis identified. 3. Mild distal small vessel atheromatous irregularity. Electronically Signed   By: Jeannine Boga M.D.   On: 11/24/2017 06:20   Mr Brain Wo Contrast  Result Date: 11/24/2017 CLINICAL DATA:  Initial evaluation for acute altered mental status. EXAM: MRI HEAD WITHOUT CONTRAST MRA HEAD WITHOUT CONTRAST TECHNIQUE: Multiplanar, multiecho pulse sequences of the brain and surrounding structures were obtained without intravenous contrast. Angiographic images of the head were obtained using MRA technique without contrast. COMPARISON:  Comparison made with prior CT from 11/23/2017 FINDINGS: MRI HEAD FINDINGS Brain: Diffuse prominence of the CSF containing spaces compatible with generalized cerebral atrophy. Patchy and confluent T2/FLAIR hyperintensity within the periventricular deep white matter both cerebral hemispheres most compatible with chronic small vessel ischemic disease, moderate nature. Chronic microvascular ischemic changes present within the pons. Patchy multifocal areas of restricted diffusion seen involving the parasagittal left temporal and occipital lobes, left thalamus, and left splenium, consistent with acute left PCA territory infarcts. Slight extension into the  left cerebral peduncle and left midbrain. No associated hemorrhage or mass effect. No  other evidence for acute or subacute ischemia. Gray-white matter differentiation otherwise maintained. No evidence for acute or chronic intracranial hemorrhage. No mass lesion, midline shift or mass effect. No hydrocephalus. No extra-axial fluid collection. Pituitary gland normal. Vascular: Major intracranial vascular flow voids maintained. Skull and upper cervical spine: Craniocervical junction within normal limits. Bone marrow signal intensity somewhat diffusely decreased on T1 weighted imaging, most commonly related to anemia, smoking, or obesity. No focal marrow replacing lesion. No scalp soft tissue abnormality. Sinuses/Orbits: Globes and orbital soft tissues within normal limits. Scattered mucosal thickening throughout the paranasal sinuses. Superimposed left maxillary sinus retention cyst noted. Trace opacity left mastoid air cells. Other: 3.4 cm lobulated well-circumscribed T2 hypointense lesion within the right parotid gland (series 19, image 17). Additional probable lesion within the left parotid gland measures 13 mm (series 19, image 17). MRA HEAD FINDINGS ANTERIOR CIRCULATION: Examination mildly degraded by motion artifact. Distal cervical segments of the internal carotid arteries are widely patent with antegrade flow. Petrous segments patent bilaterally. Mild atheromatous irregularity within the cavernous/supraclinoid ICAs without hemodynamically significant stenosis. A1 segments, anterior communicating artery common anterior cerebral arteries widely patent bilaterally. M1 segments patent without stenosis. No proximal M2 occlusion. Distal MCA branches well perfused and symmetric. Mild distal small vessel atheromatous irregularity. POSTERIOR CIRCULATION: Vertebral arteries code dominant and patent to the vertebrobasilar junction without stenosis. Posterior inferior cerebral arteries patent proximally. Basilar widely  patent to its distal aspect. Superior cerebral arteries patent bilaterally. Both of the posterior cerebral artery supplied via the basilar. Right PCA widely patent to its distal aspect. There is abrupt occlusion of the proximal left PCA, left P2 segment. No intracranial aneurysm. IMPRESSION: MRI HEAD IMPRESSION: 1. Patchy multifocal left PCA territory infarcts involving the parasagittal left temporal occipital region, left thalamus, and left splenium. No associated hemorrhage or mass effect. 2. Atrophy with moderate chronic microvascular ischemic disease. 3. 3.4 cm mass within the right parotid gland, with additional probable 13 mm left parotid lesion. Findings are nonspecific. Follow-up with nonemergent ENT consultation and referral on an outpatient basis could be performed for further evaluation as clinically warranted. MRA HEAD IMPRESSION: 1. Acute proximal left P2 occlusion. 2. Major intracranial arterial vasculature otherwise widely patent with no hemodynamically significant or correctable stenosis identified. 3. Mild distal small vessel atheromatous irregularity. Electronically Signed   By: Jeannine Boga M.D.   On: 11/24/2017 06:20    PHYSICAL EXAM  pleasant middle-aged Caucasian male currently not in distress. . Afebrile. Head is nontraumatic. Neck is supple without bruit.    Cardiac exam no murmur or gallop. Lungs are clear to auscultation. Distal pulses are well felt. Neurological Exam :  Awake alert oriented to time and place   poor recall 0/3. Follows simple midline and one-step commands. Speech is clear without dysarthria or aphasia. Able to name repeat comprehend well. Extraocular moments are full range without nystagmus. Right partial homonymous hemianopsia. Fundi were not visualized. Mild right lower facial weakness. Tongue is midline. Motor system exam revealed no upper or lower extremity drift but mild weakness of right grip and intrinsic hand muscles. Orbits left over right upper  extremity. Mild weakness of right hip flexors and ankle dorsiflexors only. Sensation appears intact bilaterally. Coordination is accurate. Gait was not tested. ASSESSMENT/PLAN [name] is a 62 y.o. male with history of tobacco use presenting with confusion, dysarthria, and decreased fine motor movement x 1 week.   Stroke:   Patchy L PCA infarcts involving the L occipital, splenium and thalamus, infarct embolic  secondary to unknown source  Resultant: R HH, short term memory deficitx  CT head old medial L temporal lobe infarct. Probable old L occipital and L thalamic infarcts. Small vessel disease. Atrophy.   MRI  Multifocal Patchy L PCA infarcts. Small vessel disease. Atrophy. R parotid 3.4 cm mass. L parotid 13 mm lesion.  MRA  Proximal L P2 occlusion. Mild Small vessel disease  Carotid Doppler  B ICA 1-39% stenosis, VAs antegrade  2D Echo Left ventricle: The cavity size was normal. Wall thickness was increased in a pattern of moderate LVH. Systolic function was mildly to moderately reduced. The estimated ejection fraction was in the range of 40% to 45%. Akinesis of the basalinferior myocardium.  TEE to look for embolic source. Arranged with [facility] for tomorrow.  If positive for PFO (patent foramen ovale), check bilateral lower extremity venous dopplers to rule out DVT as possible source of stroke. (I have made patient NPO after midnight tonight).  If TEE negative, a [facility] electrophysiologist will consult and consider placement of an implantable loop recorder to evaluate for atrial fibrillation as etiology of stroke. This has been explained to patient/family by Dr. Leonie Man and they are agreeable.   LDL 143  HgbA1c 5.8  Lovenox 40 mg sq daily for VTE prophylaxis  No antithrombotic prior to admission, now on aspirin 325 mg daily. Continue aspirin for secondary stroke prevention.  Therapy recommendations:   Rehabilitation  Disposition:  pending   Hyperlipidemia  Home meds:  No statin  LDL 143, goal < 70  Add lipitor 80  Continue statin at discharge  Other Stroke Risk Factors  Cigarette smoker, advised to stop smoking. Discussed quitting options. Has tried nicotine patches in the past without success.  ETOH use, advised to drink no more than 2 drink(s) a day  Overweight, Body mass index is 28.14 kg/m., recommend weight loss, diet and exercise as appropriate   CAD with MI in his 65s   Other Active Problems  B parotid lesions, agree with further OP evaluation. Has lad "lump" x 8 mons per significant other  Renal insufficiency improved 1.31->1.04  Hospital day # 0   . He is present with embolic left posterior cerebral artery infarct and needs ongoing stroke evaluation. Transesophageal echocardiogram and long-term cardiac monitoring if no obvious etiology is found on the testing today.long discussion with patient, girlfriend and Dr. Broadus John and answered questions. Greater than 50% time during this 25 minute visit was spent on counseling and coordination of care about his embolic stroke and answered questions  Antony Contras, MD Medical Director [place] Pager: [phone] 11/25/2017 2:52 PM  To contact Stroke Continuity provider, please refer to [email or web address] After hours, contact General Neurology

## 2017-11-25 NOTE — Progress Notes (Signed)
Pt requesting to leave before MD was ready to discharge RN explained to pt that he would be leaving against medical advice and it was not recommended that the MD did not feel that he was ready to be discharged. PT responded "so what I don't care I am ready to leave." pt continues to pull IV out and get dressed. RN notified Dr Broadus John of situation. PT given AMA paper to sigh and RN explained that the hospital would not be responsible for any outcomes. PT agreed AMA paper sighed and in pt's. Chart Dr. Broadus John notified

## 2017-11-25 NOTE — Evaluation (Signed)
Speech Language Pathology Evaluation Patient Details Name: James [name] MRN: [id number] DOB: October 02, 1955 Today's Date: 11/25/2017 Time: 0940-1010 SLP Time Calculation (min) (ACUTE ONLY): 30 min  Problem List:  Patient Active Problem List   Diagnosis Date Noted  . Acute encephalopathy 11/24/2017  . Mild renal insufficiency 11/24/2017  . History of CVA (cerebrovascular accident) 11/24/2017  . Parotid swelling 11/24/2017   Past Medical History: History reviewed. No pertinent past medical history. Past Surgical History: History reviewed. No pertinent surgical history. HPI:  62 year old male admitted 11/23/17 due to confusion, difficulty with fine motor skills  and right jaw swelling. PMH unremarkable. MRI = Patchy multifocal left PCA infarcts involving the parasagittal left temporal occipital region, left thalamus, and left splenium.   Assessment / Plan / Recommendation Clinical Impression  The Mini-Mental State Exam was administered. Pt scored 14/30 (N=24+/30), indicating significant cognitive impairment. Points were lost on the following subtests: orientation, immediate and delayed recall, attention, sentence length reading comprehension and figure drawing. Conversation with pt reveals lack of insight into deficits and functional impact. Deficits such as these raise concern for pt's ability to be safe if left alone. Pt's significant other indicates pt has been demonstrating decreased functional recall over the past several months. Continued skilled ST intervention is recommended to maximize cognitive linguistic functioning.     SLP Assessment  SLP Recommendation/Assessment: Patient needs continued Speech Language Pathology Services SLP Visit Diagnosis: Cognitive communication deficit (R41.841)    Follow Up Recommendations  (continued skilled ST intervention is recommended after acute DC)    Frequency and Duration min 1 x/week  2 weeks      SLP Evaluation Cognition  Overall Cognitive  Status: History of cognitive impairments - at baseline Arousal/Alertness: Awake/alert Orientation Level: Oriented to person;Disoriented to place;Disoriented to time;Disoriented to situation Attention: Focused;Sustained;Selective Focused Attention: Impaired Focused Attention Impairment: Verbal basic Sustained Attention: Impaired Sustained Attention Impairment: Verbal basic Selective Attention: Impaired Selective Attention Impairment: Verbal basic Memory: Impaired Memory Impairment: Storage deficit;Retrieval deficit;Decreased recall of new information;Decreased short term memory Awareness: Impaired Awareness Impairment: Intellectual impairment;Emergent impairment;Anticipatory impairment Problem Solving: Impaired Problem Solving Impairment: Verbal basic;Functional basic Safety/Judgment: Impaired       Comprehension  Auditory Comprehension Overall Auditory Comprehension: Appears within functional limits for tasks assessed Reading Comprehension Reading Status: Impaired Word level: Within functional limits Sentence Level: Impaired    Expression Expression Primary Mode of Expression: Verbal Verbal Expression Overall Verbal Expression: Appears within functional limits for tasks assessed Written Expression Dominant Hand: Right Written Expression: Within Functional Limits   Oral / Motor  Oral Motor/Sensory Function Overall Oral Motor/Sensory Function: Within functional limits Motor Speech Overall Motor Speech: Appears within functional limits for tasks assessed   GO                   Shireen Rayburn B. Quentin Ore [facility], CCC-SLP Speech Language Pathologist [phone]  Shonna Chock 11/25/2017, 10:18 AM

## 2017-11-25 NOTE — Care Management Note (Addendum)
Case Management Note  Patient Details  Name: James [name] MRN: [id number] Date of Birth: 1955/10/30  Subjective/Objective:      Subacute stroke. Resides with [place]( girlfriend).           millie Carrucini (Sgo) Gloyd Happ ([phone]           Action/Plan:  Per PT's recommendations: Supervision/Assistance - 24 hour;CIR. Declines CIR. Insist on going home. Pt without insurance, no PCP. NCM referral made with [facility] for charity home health services. Pt to f/u with [facility] for post hospital f/u,/ establish primary care on 12/14/2017 @ 1:30 pm. NCM made pt aware and provided information.  Pt states can afford Rx meds. Millie (sgo) to provide transportation to home.   Expected Discharge Date:    11/25/2017             Expected Discharge Plan:  [place]  In-House Referral:     Discharge planning Services  CM Consult  Post Acute Care Choice:    Choice offered to:  (charity case)   DME Arranged:   n/a DME Agency:   n/a  HH Arranged:   PT,OT,SW [facility] [place], pending charity approval. Liaison to f/u with pt on 9/6.  Status of Service:  completed  If discussed at Long Length of Stay Meetings, dates discussed:    Additional Comments:  Sharin Mons, RN 11/25/2017, 4:48 PM

## 2017-11-25 NOTE — Progress Notes (Signed)
PROGRESS NOTE    James [name]  UXN:[id number] DOB: [birth year] DOA: 11/23/2017 PCP: Patient, No Pcp Per  Brief Narrative: 62 year old male who does not seek regular medical care, remote history of CAD and stents from 5 years ago in ER, was brought to the emergency room by his girlfriend for evaluation of ongoing confusion, fine motor weakness in coordination for approximately 1 week -CT in the emergency room noted subacute infarcts in the medial left temporal lobe and left occipital lobe, left thalamus with atrophy and mild small vessel white matter changes.   Assessment & Plan:   1. Subacute stroke -MRI noted patchy left PCA infarcts involving the left occiput, thalamus, splenium -With resultant mild right hemiplegia and cognitive, memory deficits -Neurology consulting, TEE recommended to look for embolic source -Carotid duplex at 1-39% stenosis, 2-D echocardiogram shows EF of 45% -LDL is 143, started statin -Hemoglobin A1c is 5.8 consistent with borderline diabetes, last modification recommended -PT OT, SLP evaluations ongoing, patient less inclined to go to rehabilitation -patient had originally agreed to TEE, subsequently when cardiology PA discussed further he refused this, finally agreeable again after extensive discussion per Dr. Leonie Man  2. Cardiomyopathy -EF 45%, likely ischemic -Per girlfriend history of MI about 4-5 years ago in in [place], had angioplasty and 2 stents placed -Continue aspirin, statin, add low-dose beta blocker -needs cardiology follow-up  3. Mild Renal insufficiency -resolved with hydration  3. Right parotid swelling -recommend nonurgent ENT follow-up for this  4. Prior CAD -history of MI and 2 stents, in [place] approximately 4-5 years ago -EF 45% -see above   DVT prophylaxis: Lovenox Code Status: Full Code Family Communication: ) at bedside Disposition Plan: likely home with home health services if declines CIR  Consultants:    neurology   Procedures:   Antimicrobials:    Subjective: -belligerent, continues to have significant short-term memory deficits -Reports mild right-sided weakness Objective: Vitals:   11/24/17 1009 11/24/17 1236 11/24/17 2215 11/25/17 0606  BP: (!) 121/93 133/88 129/90 (!) 135/92  Pulse: 86 96 73 80  Resp:  18 17 17   Temp:  98.9 F (37.2 C) 98.2 F (36.8 C) 97.7 F (36.5 C)  TempSrc:   Oral Oral  SpO2: 95% 98% 96% 100%  Weight:      Height:        Intake/Output Summary (Last 24 hours) at 11/25/2017 1317 Last data filed at 11/25/2017 0919 Gross per 24 hour  Intake 300 ml  Output 2150 ml  Net -1850 ml   Filed Weights   11/23/17 2108 11/24/17 0215  Weight: 72.6 kg 94.1 kg    Examination:  General exam: awake alert, sitting in the recliner, no distress Respiratory system: Clear to auscultation. Respiratory effort normal. Cardiovascular system: S1 & S2 heard, RRR.  Gastrointestinal system: Abdomen is nondistended, soft and nontender.Normal bowel sounds heard. Central nervous system: Alert, alert, oriented to self and only partly to place, mild right upper extremity and right lower extremity weakness 4+/5 Extremities: Symmetric 5 x 5 power. Skin: No rashes, lesions or ulcers Psychiatry: cognitive dysfunction noted    Data Reviewed:   CBC: Recent Labs  Lab 11/23/17 2112  WBC 11.6*  HGB 17.0  HCT 49.8  MCV 91.9  PLT 254   Basic Metabolic Panel: Recent Labs  Lab 11/23/17 2112 11/24/17 0351  NA 139 140  K 3.8 3.7  CL 106 107  CO2 21* 23  GLUCOSE 130* 93  BUN 13 16  CREATININE 1.31* 1.04  CALCIUM 9.2  8.9   GFR: Estimated Creatinine Clearance: 85.5 mL/min (by C-G formula based on SCr of 1.04 mg/dL). Liver Function Tests: Recent Labs  Lab 11/23/17 2112  AST 27  ALT 49*  ALKPHOS 94  BILITOT 1.0  PROT 7.1  ALBUMIN 4.0   No results for input(s): LIPASE, AMYLASE in the last 168 hours. No results for input(s): AMMONIA in the last 168  hours. Coagulation Profile: No results for input(s): INR, PROTIME in the last 168 hours. Cardiac Enzymes: No results for input(s): CKTOTAL, CKMB, CKMBINDEX, TROPONINI in the last 168 hours. BNP (last 3 results) No results for input(s): PROBNP in the last 8760 hours. HbA1C: Recent Labs    11/24/17 0351  HGBA1C 5.8*   CBG: Recent Labs  Lab 11/23/17 2115  GLUCAP 120*   Lipid Profile: Recent Labs    11/24/17 0351  CHOL 204*  HDL 34*  LDLCALC 143*  TRIG 135  CHOLHDL 6.0   Thyroid Function Tests: Recent Labs    11/24/17 1516  TSH 1.255   Anemia Panel: Recent Labs    11/24/17 1516  VITAMINB12 310   Urine analysis:    Component Value Date/Time   COLORURINE YELLOW 11/24/2017 [place] 11/24/2017 0644   LABSPEC 1.024 11/24/2017 0644   PHURINE 5.0 11/24/2017 0644   GLUCOSEU NEGATIVE 11/24/2017 0644   HGBUR NEGATIVE 11/24/2017 0644   BILIRUBINUR NEGATIVE 11/24/2017 0644   KETONESUR NEGATIVE 11/24/2017 0644   PROTEINUR NEGATIVE 11/24/2017 0644   NITRITE NEGATIVE 11/24/2017 0644   LEUKOCYTESUR NEGATIVE 11/24/2017 0644   Sepsis Labs: @LABRCNTIP (procalcitonin:4,lacticidven:4)  )No results found for this or any previous visit (from the past 240 hour(s)).       Radiology Studies: Ct Head Wo Contrast  Result Date: 11/23/2017 CLINICAL DATA:  Ataxia EXAM: CT HEAD WITHOUT CONTRAST TECHNIQUE: Contiguous axial images were obtained from the base of the skull through the vertex without intravenous contrast. COMPARISON:  None. FINDINGS: Brain: No hemorrhage or intracranial mass is visualized. Mild atrophy. Mild small vessel ischemic changes of the white matter. Probable chronic left thalamic infarct and small suspected chronic left occipital infarct. Age indeterminate infarct within the medial left temporal lobe. Ventricles are nonenlarged. Vascular: No hyperdense vessels. Vertebral artery and carotid vascular calcification Skull: No fracture Sinuses/Orbits:  Mucosal thickening in the ethmoid and maxillary sinuses. Other: None IMPRESSION: 1. Age indeterminate infarct within the medial left temporal lobe. Small infarcts in the left occipital lobe and left thalamus, probably chronic. 2. Atrophy with mild small vessel ischemic changes of the white matter. Electronically Signed   By: Donavan Foil M.D.   On: 11/23/2017 22:48   Mr Jodene Nam Head Wo Contrast  Result Date: 11/24/2017 CLINICAL DATA:  Initial evaluation for acute altered mental status. EXAM: MRI HEAD WITHOUT CONTRAST MRA HEAD WITHOUT CONTRAST TECHNIQUE: Multiplanar, multiecho pulse sequences of the brain and surrounding structures were obtained without intravenous contrast. Angiographic images of the head were obtained using MRA technique without contrast. COMPARISON:  Comparison made with prior CT from 11/23/2017 FINDINGS: MRI HEAD FINDINGS Brain: Diffuse prominence of the CSF containing spaces compatible with generalized cerebral atrophy. Patchy and confluent T2/FLAIR hyperintensity within the periventricular deep white matter both cerebral hemispheres most compatible with chronic small vessel ischemic disease, moderate nature. Chronic microvascular ischemic changes present within the pons. Patchy multifocal areas of restricted diffusion seen involving the parasagittal left temporal and occipital lobes, left thalamus, and left splenium, consistent with acute left PCA territory infarcts. Slight extension into the left cerebral peduncle and  left midbrain. No associated hemorrhage or mass effect. No other evidence for acute or subacute ischemia. Gray-white matter differentiation otherwise maintained. No evidence for acute or chronic intracranial hemorrhage. No mass lesion, midline shift or mass effect. No hydrocephalus. No extra-axial fluid collection. Pituitary gland normal. Vascular: Major intracranial vascular flow voids maintained. Skull and upper cervical spine: Craniocervical junction within normal limits. Bone  marrow signal intensity somewhat diffusely decreased on T1 weighted imaging, most commonly related to anemia, smoking, or obesity. No focal marrow replacing lesion. No scalp soft tissue abnormality. Sinuses/Orbits: Globes and orbital soft tissues within normal limits. Scattered mucosal thickening throughout the paranasal sinuses. Superimposed left maxillary sinus retention cyst noted. Trace opacity left mastoid air cells. Other: 3.4 cm lobulated well-circumscribed T2 hypointense lesion within the right parotid gland (series 19, image 17). Additional probable lesion within the left parotid gland measures 13 mm (series 19, image 17). MRA HEAD FINDINGS ANTERIOR CIRCULATION: Examination mildly degraded by motion artifact. Distal cervical segments of the internal carotid arteries are widely patent with antegrade flow. Petrous segments patent bilaterally. Mild atheromatous irregularity within the cavernous/supraclinoid ICAs without hemodynamically significant stenosis. A1 segments, anterior communicating artery common anterior cerebral arteries widely patent bilaterally. M1 segments patent without stenosis. No proximal M2 occlusion. Distal MCA branches well perfused and symmetric. Mild distal small vessel atheromatous irregularity. POSTERIOR CIRCULATION: Vertebral arteries code dominant and patent to the vertebrobasilar junction without stenosis. Posterior inferior cerebral arteries patent proximally. Basilar widely patent to its distal aspect. Superior cerebral arteries patent bilaterally. Both of the posterior cerebral artery supplied via the basilar. Right PCA widely patent to its distal aspect. There is abrupt occlusion of the proximal left PCA, left P2 segment. No intracranial aneurysm. IMPRESSION: MRI HEAD IMPRESSION: 1. Patchy multifocal left PCA territory infarcts involving the parasagittal left temporal occipital region, left thalamus, and left splenium. No associated hemorrhage or mass effect. 2. Atrophy with  moderate chronic microvascular ischemic disease. 3. 3.4 cm mass within the right parotid gland, with additional probable 13 mm left parotid lesion. Findings are nonspecific. Follow-up with nonemergent ENT consultation and referral on an outpatient basis could be performed for further evaluation as clinically warranted. MRA HEAD IMPRESSION: 1. Acute proximal left P2 occlusion. 2. Major intracranial arterial vasculature otherwise widely patent with no hemodynamically significant or correctable stenosis identified. 3. Mild distal small vessel atheromatous irregularity. Electronically Signed   By: Jeannine Boga M.D.   On: 11/24/2017 06:20   Mr Brain Wo Contrast  Result Date: 11/24/2017 CLINICAL DATA:  Initial evaluation for acute altered mental status. EXAM: MRI HEAD WITHOUT CONTRAST MRA HEAD WITHOUT CONTRAST TECHNIQUE: Multiplanar, multiecho pulse sequences of the brain and surrounding structures were obtained without intravenous contrast. Angiographic images of the head were obtained using MRA technique without contrast. COMPARISON:  Comparison made with prior CT from 11/23/2017 FINDINGS: MRI HEAD FINDINGS Brain: Diffuse prominence of the CSF containing spaces compatible with generalized cerebral atrophy. Patchy and confluent T2/FLAIR hyperintensity within the periventricular deep white matter both cerebral hemispheres most compatible with chronic small vessel ischemic disease, moderate nature. Chronic microvascular ischemic changes present within the pons. Patchy multifocal areas of restricted diffusion seen involving the parasagittal left temporal and occipital lobes, left thalamus, and left splenium, consistent with acute left PCA territory infarcts. Slight extension into the left cerebral peduncle and left midbrain. No associated hemorrhage or mass effect. No other evidence for acute or subacute ischemia. Gray-white matter differentiation otherwise maintained. No evidence for acute or chronic  intracranial hemorrhage. No mass lesion,  midline shift or mass effect. No hydrocephalus. No extra-axial fluid collection. Pituitary gland normal. Vascular: Major intracranial vascular flow voids maintained. Skull and upper cervical spine: Craniocervical junction within normal limits. Bone marrow signal intensity somewhat diffusely decreased on T1 weighted imaging, most commonly related to anemia, smoking, or obesity. No focal marrow replacing lesion. No scalp soft tissue abnormality. Sinuses/Orbits: Globes and orbital soft tissues within normal limits. Scattered mucosal thickening throughout the paranasal sinuses. Superimposed left maxillary sinus retention cyst noted. Trace opacity left mastoid air cells. Other: 3.4 cm lobulated well-circumscribed T2 hypointense lesion within the right parotid gland (series 19, image 17). Additional probable lesion within the left parotid gland measures 13 mm (series 19, image 17). MRA HEAD FINDINGS ANTERIOR CIRCULATION: Examination mildly degraded by motion artifact. Distal cervical segments of the internal carotid arteries are widely patent with antegrade flow. Petrous segments patent bilaterally. Mild atheromatous irregularity within the cavernous/supraclinoid ICAs without hemodynamically significant stenosis. A1 segments, anterior communicating artery common anterior cerebral arteries widely patent bilaterally. M1 segments patent without stenosis. No proximal M2 occlusion. Distal MCA branches well perfused and symmetric. Mild distal small vessel atheromatous irregularity. POSTERIOR CIRCULATION: Vertebral arteries code dominant and patent to the vertebrobasilar junction without stenosis. Posterior inferior cerebral arteries patent proximally. Basilar widely patent to its distal aspect. Superior cerebral arteries patent bilaterally. Both of the posterior cerebral artery supplied via the basilar. Right PCA widely patent to its distal aspect. There is abrupt occlusion of the  proximal left PCA, left P2 segment. No intracranial aneurysm. IMPRESSION: MRI HEAD IMPRESSION: 1. Patchy multifocal left PCA territory infarcts involving the parasagittal left temporal occipital region, left thalamus, and left splenium. No associated hemorrhage or mass effect. 2. Atrophy with moderate chronic microvascular ischemic disease. 3. 3.4 cm mass within the right parotid gland, with additional probable 13 mm left parotid lesion. Findings are nonspecific. Follow-up with nonemergent ENT consultation and referral on an outpatient basis could be performed for further evaluation as clinically warranted. MRA HEAD IMPRESSION: 1. Acute proximal left P2 occlusion. 2. Major intracranial arterial vasculature otherwise widely patent with no hemodynamically significant or correctable stenosis identified. 3. Mild distal small vessel atheromatous irregularity. Electronically Signed   By: Jeannine Boga M.D.   On: 11/24/2017 06:20        Scheduled Meds: . aspirin  325 mg Oral Daily  . atorvastatin  80 mg Oral q1800  . enoxaparin (LOVENOX) injection  40 mg Subcutaneous Q24H  . sodium chloride flush  3 mL Intravenous Q12H   Continuous Infusions:   LOS: 0 days    Time spent: 51min    Domenic Polite, MD [facility] Page via [email or web address], password [email or web address] After 7PM please contact night-coverage  11/25/2017, 1:17 PM

## 2017-11-25 NOTE — Interval H&P Note (Signed)
History and Physical Interval Note:  11/25/2017 1:29 PM  James [name]  has presented today for surgery, with the diagnosis of stroke  The various methods of treatment have been discussed with the patient and family. After consideration of risks, benefits and other options for treatment, the patient has consented to  Procedure(s): TRANSESOPHAGEAL ECHOCARDIOGRAM (TEE) (N/A) as a surgical intervention .  The patient's history has been reviewed, patient examined, no change in status, stable for surgery.  I have reviewed the patient's chart and labs.  Questions were answered to the patient's satisfaction.     Janay Canan

## 2017-11-25 NOTE — CV Procedure (Signed)
    TRANSESOPHAGEAL ECHOCARDIOGRAM   NAME:  James [name]   MRN: [id number] DOB:  1956-01-29   ADMIT DATE: 11/23/2017  INDICATIONS: CVA   PROCEDURE:   Informed consent was obtained prior to the procedure. The risks, benefits and alternatives for the procedure were discussed and the patient comprehended these risks.  Risks include, but are not limited to, cough, sore throat, vomiting, nausea, somnolence, esophageal and stomach trauma or perforation, bleeding, low blood pressure, aspiration, pneumonia, infection, trauma to the teeth and death.    After a procedural time-out, the patient was given 4 mg versed and 25 mcg fentanyl for moderate sedation.  The oropharynx was anesthetized 10 cc of topical 1% viscous lidocaine.  The transesophageal probe was inserted in the esophagus and stomach without difficulty and multiple views were obtained.    COMPLICATIONS:    There were no immediate complications.  FINDINGS:  LEFT VENTRICLE: EF = 50-55%. Possible mild hypokinesis of basal inferior wall  RIGHT VENTRICLE: Normal size and function.   LEFT ATRIUM: Normal  LEFT ATRIAL APPENDAGE: Not well visualized  RIGHT ATRIUM: Normal  AORTIC VALVE:  [place]. No AI/AS  MITRAL VALVE:    Normal. Trivial MR  TRICUSPID VALVE: Normal. Trivial TR  PULMONIC VALVE: Grossly normal. Trivial PR  INTERATRIAL SEPTUM: Small PFO (not well visualized) but weakly positive bubble study with abdominal pressure applied  PERICARDIUM: No effusion  DESCENDING AORTA: Mild to moderate plaque    Benay Spice 2:56 PM

## 2017-11-25 NOTE — Progress Notes (Signed)
Rehab Admissions Coordinator Note:  Per PT and OT recommendation, Patient was screened by Jhonnie Garner for appropriateness for an Inpatient Acute Rehab Consult.  At this time, we are recommending Inpatient Rehab consult. AC will contact MD regarding IP Rehab Consult Order. Please call if questions.   Jhonnie Garner 11/25/2017, 10:44 AM  I can be reached at [phone].

## 2017-11-25 NOTE — H&P (View-Only) (Signed)
PROGRESS NOTE    Melissa Pulido  QZR:[id number] DOB: 1955-11-04 DOA: 11/23/2017 PCP: Patient, No Pcp Per  Brief Narrative: 62 year old male who does not seek regular medical care, remote history of CAD and stents from 5 years ago in ER, was brought to the emergency room by his girlfriend for evaluation of ongoing confusion, fine motor weakness in coordination for approximately 1 week -CT in the emergency room noted subacute infarcts in the medial left temporal lobe and left occipital lobe, left thalamus with atrophy and mild small vessel white matter changes.   Assessment & Plan:   1. Subacute stroke -MRI noted patchy left PCA infarcts involving the left occiput, thalamus, splenium -With resultant mild right hemiplegia and cognitive, memory deficits -Neurology consulting, TEE recommended to look for embolic source -Carotid duplex at 1-39% stenosis, 2-D echocardiogram shows EF of 45% -LDL is 143, started statin -Hemoglobin A1c is 5.8 consistent with borderline diabetes, last modification recommended -PT OT, SLP evaluations ongoing, patient less inclined to go to rehabilitation -patient had originally agreed to TEE, subsequently when cardiology PA discussed further he refused this, finally agreeable again after extensive discussion per Dr. Leonie Man  2. Cardiomyopathy -EF 45%, likely ischemic -Per girlfriend history of MI about 4-5 years ago in in [place], had angioplasty and 2 stents placed -Continue aspirin, statin, add low-dose beta blocker -needs cardiology follow-up  3. Mild Renal insufficiency -resolved with hydration  3. Right parotid swelling -recommend nonurgent ENT follow-up for this  4. Prior CAD -history of MI and 2 stents, in [place] approximately 4-5 years ago -EF 45% -see above   DVT prophylaxis: Lovenox Code Status: Full Code Family Communication: ) at bedside Disposition Plan: likely home with home health services if declines CIR  Consultants:    neurology   Procedures:   Antimicrobials:    Subjective: -belligerent, continues to have significant short-term memory deficits -Reports mild right-sided weakness Objective: Vitals:   11/24/17 1009 11/24/17 1236 11/24/17 2215 11/25/17 0606  BP: (!) 121/93 133/88 129/90 (!) 135/92  Pulse: 86 96 73 80  Resp:  18 17 17   Temp:  98.9 F (37.2 C) 98.2 F (36.8 C) 97.7 F (36.5 C)  TempSrc:   Oral Oral  SpO2: 95% 98% 96% 100%  Weight:      Height:        Intake/Output Summary (Last 24 hours) at 11/25/2017 1317 Last data filed at 11/25/2017 0919 Gross per 24 hour  Intake 300 ml  Output 2150 ml  Net -1850 ml   Filed Weights   11/23/17 2108 11/24/17 0215  Weight: 72.6 kg 94.1 kg    Examination:  General exam: awake alert, sitting in the recliner, no distress Respiratory system: Clear to auscultation. Respiratory effort normal. Cardiovascular system: S1 & S2 heard, RRR.  Gastrointestinal system: Abdomen is nondistended, soft and nontender.Normal bowel sounds heard. Central nervous system: Alert, alert, oriented to self and only partly to place, mild right upper extremity and right lower extremity weakness 4+/5 Extremities: Symmetric 5 x 5 power. Skin: No rashes, lesions or ulcers Psychiatry: cognitive dysfunction noted    Data Reviewed:   CBC: Recent Labs  Lab 11/23/17 2112  WBC 11.6*  HGB 17.0  HCT 49.8  MCV 91.9  PLT 354   Basic Metabolic Panel: Recent Labs  Lab 11/23/17 2112 11/24/17 0351  NA 139 140  K 3.8 3.7  CL 106 107  CO2 21* 23  GLUCOSE 130* 93  BUN 13 16  CREATININE 1.31* 1.04  CALCIUM 9.2  8.9   GFR: Estimated Creatinine Clearance: 85.5 mL/min (by C-G formula based on SCr of 1.04 mg/dL). Liver Function Tests: Recent Labs  Lab 11/23/17 2112  AST 27  ALT 49*  ALKPHOS 94  BILITOT 1.0  PROT 7.1  ALBUMIN 4.0   No results for input(s): LIPASE, AMYLASE in the last 168 hours. No results for input(s): AMMONIA in the last 168  hours. Coagulation Profile: No results for input(s): INR, PROTIME in the last 168 hours. Cardiac Enzymes: No results for input(s): CKTOTAL, CKMB, CKMBINDEX, TROPONINI in the last 168 hours. BNP (last 3 results) No results for input(s): PROBNP in the last 8760 hours. HbA1C: Recent Labs    11/24/17 0351  HGBA1C 5.8*   CBG: Recent Labs  Lab 11/23/17 2115  GLUCAP 120*   Lipid Profile: Recent Labs    11/24/17 0351  CHOL 204*  HDL 34*  LDLCALC 143*  TRIG 135  CHOLHDL 6.0   Thyroid Function Tests: Recent Labs    11/24/17 1516  TSH 1.255   Anemia Panel: Recent Labs    11/24/17 1516  VITAMINB12 310   Urine analysis:    Component Value Date/Time   COLORURINE YELLOW 11/24/2017 [place] 11/24/2017 0644   LABSPEC 1.024 11/24/2017 0644   PHURINE 5.0 11/24/2017 [place] 11/24/2017 0644   HGBUR NEGATIVE 11/24/2017 0644   BILIRUBINUR NEGATIVE 11/24/2017 0644   KETONESUR NEGATIVE 11/24/2017 0644   PROTEINUR NEGATIVE 11/24/2017 0644   NITRITE NEGATIVE 11/24/2017 0644   LEUKOCYTESUR NEGATIVE 11/24/2017 0644   Sepsis Labs: @LABRCNTIP (procalcitonin:4,lacticidven:4)  )No results found for this or any previous visit (from the past 240 hour(s)).       Radiology Studies: Ct Head Wo Contrast  Result Date: 11/23/2017 CLINICAL DATA:  Ataxia EXAM: CT HEAD WITHOUT CONTRAST TECHNIQUE: Contiguous axial images were obtained from the base of the skull through the vertex without intravenous contrast. COMPARISON:  None. FINDINGS: Brain: No hemorrhage or intracranial mass is visualized. Mild atrophy. Mild small vessel ischemic changes of the white matter. Probable chronic left thalamic infarct and small suspected chronic left occipital infarct. Age indeterminate infarct within the medial left temporal lobe. Ventricles are nonenlarged. Vascular: No hyperdense vessels. Vertebral artery and carotid vascular calcification Skull: No fracture Sinuses/Orbits:  Mucosal thickening in the ethmoid and maxillary sinuses. Other: None IMPRESSION: 1. Age indeterminate infarct within the medial left temporal lobe. Small infarcts in the left occipital lobe and left thalamus, probably chronic. 2. Atrophy with mild small vessel ischemic changes of the white matter. Electronically Signed   By: Donavan Foil M.D.   On: 11/23/2017 22:48   Mr Jodene Nam Head Wo Contrast  Result Date: 11/24/2017 CLINICAL DATA:  Initial evaluation for acute altered mental status. EXAM: MRI HEAD WITHOUT CONTRAST MRA HEAD WITHOUT CONTRAST TECHNIQUE: Multiplanar, multiecho pulse sequences of the brain and surrounding structures were obtained without intravenous contrast. Angiographic images of the head were obtained using MRA technique without contrast. COMPARISON:  Comparison made with prior CT from 11/23/2017 FINDINGS: MRI HEAD FINDINGS Brain: Diffuse prominence of the CSF containing spaces compatible with generalized cerebral atrophy. Patchy and confluent T2/FLAIR hyperintensity within the periventricular deep white matter both cerebral hemispheres most compatible with chronic small vessel ischemic disease, moderate nature. Chronic microvascular ischemic changes present within the pons. Patchy multifocal areas of restricted diffusion seen involving the parasagittal left temporal and occipital lobes, left thalamus, and left splenium, consistent with acute left PCA territory infarcts. Slight extension into the left cerebral peduncle and  left midbrain. No associated hemorrhage or mass effect. No other evidence for acute or subacute ischemia. Gray-white matter differentiation otherwise maintained. No evidence for acute or chronic intracranial hemorrhage. No mass lesion, midline shift or mass effect. No hydrocephalus. No extra-axial fluid collection. Pituitary gland normal. Vascular: Major intracranial vascular flow voids maintained. Skull and upper cervical spine: Craniocervical junction within normal limits. Bone  marrow signal intensity somewhat diffusely decreased on T1 weighted imaging, most commonly related to anemia, smoking, or obesity. No focal marrow replacing lesion. No scalp soft tissue abnormality. Sinuses/Orbits: Globes and orbital soft tissues within normal limits. Scattered mucosal thickening throughout the paranasal sinuses. Superimposed left maxillary sinus retention cyst noted. Trace opacity left mastoid air cells. Other: 3.4 cm lobulated well-circumscribed T2 hypointense lesion within the right parotid gland (series 19, image 17). Additional probable lesion within the left parotid gland measures 13 mm (series 19, image 17). MRA HEAD FINDINGS ANTERIOR CIRCULATION: Examination mildly degraded by motion artifact. Distal cervical segments of the internal carotid arteries are widely patent with antegrade flow. Petrous segments patent bilaterally. Mild atheromatous irregularity within the cavernous/supraclinoid ICAs without hemodynamically significant stenosis. A1 segments, anterior communicating artery common anterior cerebral arteries widely patent bilaterally. M1 segments patent without stenosis. No proximal M2 occlusion. Distal MCA branches well perfused and symmetric. Mild distal small vessel atheromatous irregularity. POSTERIOR CIRCULATION: Vertebral arteries code dominant and patent to the vertebrobasilar junction without stenosis. Posterior inferior cerebral arteries patent proximally. Basilar widely patent to its distal aspect. Superior cerebral arteries patent bilaterally. Both of the posterior cerebral artery supplied via the basilar. Right PCA widely patent to its distal aspect. There is abrupt occlusion of the proximal left PCA, left P2 segment. No intracranial aneurysm. IMPRESSION: MRI HEAD IMPRESSION: 1. Patchy multifocal left PCA territory infarcts involving the parasagittal left temporal occipital region, left thalamus, and left splenium. No associated hemorrhage or mass effect. 2. Atrophy with  moderate chronic microvascular ischemic disease. 3. 3.4 cm mass within the right parotid gland, with additional probable 13 mm left parotid lesion. Findings are nonspecific. Follow-up with nonemergent ENT consultation and referral on an outpatient basis could be performed for further evaluation as clinically warranted. MRA HEAD IMPRESSION: 1. Acute proximal left P2 occlusion. 2. Major intracranial arterial vasculature otherwise widely patent with no hemodynamically significant or correctable stenosis identified. 3. Mild distal small vessel atheromatous irregularity. Electronically Signed   By: Jeannine Boga M.D.   On: 11/24/2017 06:20   Mr Brain Wo Contrast  Result Date: 11/24/2017 CLINICAL DATA:  Initial evaluation for acute altered mental status. EXAM: MRI HEAD WITHOUT CONTRAST MRA HEAD WITHOUT CONTRAST TECHNIQUE: Multiplanar, multiecho pulse sequences of the brain and surrounding structures were obtained without intravenous contrast. Angiographic images of the head were obtained using MRA technique without contrast. COMPARISON:  Comparison made with prior CT from 11/23/2017 FINDINGS: MRI HEAD FINDINGS Brain: Diffuse prominence of the CSF containing spaces compatible with generalized cerebral atrophy. Patchy and confluent T2/FLAIR hyperintensity within the periventricular deep white matter both cerebral hemispheres most compatible with chronic small vessel ischemic disease, moderate nature. Chronic microvascular ischemic changes present within the pons. Patchy multifocal areas of restricted diffusion seen involving the parasagittal left temporal and occipital lobes, left thalamus, and left splenium, consistent with acute left PCA territory infarcts. Slight extension into the left cerebral peduncle and left midbrain. No associated hemorrhage or mass effect. No other evidence for acute or subacute ischemia. Gray-white matter differentiation otherwise maintained. No evidence for acute or chronic  intracranial hemorrhage. No mass lesion,  midline shift or mass effect. No hydrocephalus. No extra-axial fluid collection. Pituitary gland normal. Vascular: Major intracranial vascular flow voids maintained. Skull and upper cervical spine: Craniocervical junction within normal limits. Bone marrow signal intensity somewhat diffusely decreased on T1 weighted imaging, most commonly related to anemia, smoking, or obesity. No focal marrow replacing lesion. No scalp soft tissue abnormality. Sinuses/Orbits: Globes and orbital soft tissues within normal limits. Scattered mucosal thickening throughout the paranasal sinuses. Superimposed left maxillary sinus retention cyst noted. Trace opacity left mastoid air cells. Other: 3.4 cm lobulated well-circumscribed T2 hypointense lesion within the right parotid gland (series 19, image 17). Additional probable lesion within the left parotid gland measures 13 mm (series 19, image 17). MRA HEAD FINDINGS ANTERIOR CIRCULATION: Examination mildly degraded by motion artifact. Distal cervical segments of the internal carotid arteries are widely patent with antegrade flow. Petrous segments patent bilaterally. Mild atheromatous irregularity within the cavernous/supraclinoid ICAs without hemodynamically significant stenosis. A1 segments, anterior communicating artery common anterior cerebral arteries widely patent bilaterally. M1 segments patent without stenosis. No proximal M2 occlusion. Distal MCA branches well perfused and symmetric. Mild distal small vessel atheromatous irregularity. POSTERIOR CIRCULATION: Vertebral arteries code dominant and patent to the vertebrobasilar junction without stenosis. Posterior inferior cerebral arteries patent proximally. Basilar widely patent to its distal aspect. Superior cerebral arteries patent bilaterally. Both of the posterior cerebral artery supplied via the basilar. Right PCA widely patent to its distal aspect. There is abrupt occlusion of the  proximal left PCA, left P2 segment. No intracranial aneurysm. IMPRESSION: MRI HEAD IMPRESSION: 1. Patchy multifocal left PCA territory infarcts involving the parasagittal left temporal occipital region, left thalamus, and left splenium. No associated hemorrhage or mass effect. 2. Atrophy with moderate chronic microvascular ischemic disease. 3. 3.4 cm mass within the right parotid gland, with additional probable 13 mm left parotid lesion. Findings are nonspecific. Follow-up with nonemergent ENT consultation and referral on an outpatient basis could be performed for further evaluation as clinically warranted. MRA HEAD IMPRESSION: 1. Acute proximal left P2 occlusion. 2. Major intracranial arterial vasculature otherwise widely patent with no hemodynamically significant or correctable stenosis identified. 3. Mild distal small vessel atheromatous irregularity. Electronically Signed   By: Jeannine Boga M.D.   On: 11/24/2017 06:20        Scheduled Meds: . aspirin  325 mg Oral Daily  . atorvastatin  80 mg Oral q1800  . enoxaparin (LOVENOX) injection  40 mg Subcutaneous Q24H  . sodium chloride flush  3 mL Intravenous Q12H   Continuous Infusions:   LOS: 0 days    Time spent: 65min    Domenic Polite, MD [facility] Page via [email or web address], password [email or web address] After 7PM please contact night-coverage  11/25/2017, 1:17 PM

## 2017-11-26 ENCOUNTER — Other Ambulatory Visit: Payer: Self-pay | Admitting: Physician Assistant

## 2017-11-29 ENCOUNTER — Encounter (HOSPITAL_COMMUNITY): Payer: Self-pay | Admitting: Internal Medicine

## 2017-11-29 ENCOUNTER — Emergency Department (HOSPITAL_COMMUNITY): Payer: Self-pay

## 2017-11-29 ENCOUNTER — Emergency Department (HOSPITAL_COMMUNITY)
Admission: EM | Admit: 2017-11-29 | Discharge: 2017-11-29 | Disposition: A | Discharge status: Home / Self Care | Payer: Self-pay | Attending: Emergency Medicine | Admitting: Emergency Medicine

## 2017-11-29 ENCOUNTER — Other Ambulatory Visit: Payer: Self-pay

## 2017-11-29 DIAGNOSIS — I639 Cerebral infarction, unspecified: Secondary | ICD-10-CM | POA: Insufficient documentation

## 2017-11-29 DIAGNOSIS — Z7982 Long term (current) use of aspirin: Secondary | ICD-10-CM | POA: Insufficient documentation

## 2017-11-29 DIAGNOSIS — F17210 Nicotine dependence, cigarettes, uncomplicated: Secondary | ICD-10-CM | POA: Insufficient documentation

## 2017-11-29 DIAGNOSIS — Z79899 Other long term (current) drug therapy: Secondary | ICD-10-CM | POA: Insufficient documentation

## 2017-11-29 LAB — URINALYSIS, ROUTINE W REFLEX MICROSCOPIC
Bilirubin Urine: NEGATIVE
Glucose, UA: NEGATIVE mg/dL
Hgb urine dipstick: NEGATIVE
Ketones, ur: NEGATIVE mg/dL
Leukocytes, UA: NEGATIVE
Nitrite: NEGATIVE
Protein, ur: NEGATIVE mg/dL
Specific Gravity, Urine: 1.014 (ref 1.005–1.030)
pH: 5 (ref 5.0–8.0)

## 2017-11-29 LAB — I-STAT CHEM 8, ED
BUN: 11 mg/dL (ref 8–23)
Calcium, Ion: 1.14 mmol/L — ABNORMAL LOW (ref 1.15–1.40)
Chloride: 104 mmol/L (ref 98–111)
Creatinine, Ser: 1.1 mg/dL (ref 0.61–1.24)
Glucose, Bld: 97 mg/dL (ref 70–99)
HCT: 47 % (ref 39.0–52.0)
Hemoglobin: 16 g/dL (ref 13.0–17.0)
Potassium: 3.6 mmol/L (ref 3.5–5.1)
Sodium: 139 mmol/L (ref 135–145)
TCO2: 25 mmol/L (ref 22–32)

## 2017-11-29 LAB — DIFFERENTIAL
Abs Immature Granulocytes: 0 10*3/uL (ref 0.0–0.1)
Basophils Absolute: 0.1 10*3/uL (ref 0.0–0.1)
Basophils Relative: 1 %
Eosinophils Absolute: 0.2 10*3/uL (ref 0.0–0.7)
Eosinophils Relative: 2 %
Immature Granulocytes: 0 %
Lymphocytes Relative: 33 %
Lymphs Abs: 3 10*3/uL (ref 0.7–4.0)
Monocytes Absolute: 0.8 10*3/uL (ref 0.1–1.0)
Monocytes Relative: 8 %
Neutro Abs: 5.1 10*3/uL (ref 1.7–7.7)
Neutrophils Relative %: 56 %

## 2017-11-29 LAB — RAPID URINE DRUG SCREEN, HOSP PERFORMED
Amphetamines: NOT DETECTED
Barbiturates: NOT DETECTED
Benzodiazepines: NOT DETECTED
Cocaine: NOT DETECTED
Opiates: NOT DETECTED
Tetrahydrocannabinol: NOT DETECTED

## 2017-11-29 LAB — CBC
HCT: 47.3 % (ref 39.0–52.0)
Hemoglobin: 15.6 g/dL (ref 13.0–17.0)
MCH: 31 pg (ref 26.0–34.0)
MCHC: 33 g/dL (ref 30.0–36.0)
MCV: 93.8 fL (ref 78.0–100.0)
Platelets: 214 10*3/uL (ref 150–400)
RBC: 5.04 MIL/uL (ref 4.22–5.81)
RDW: 12.6 % (ref 11.5–15.5)
WBC: 9.2 10*3/uL (ref 4.0–10.5)

## 2017-11-29 LAB — COMPREHENSIVE METABOLIC PANEL
ALT: 43 U/L (ref 0–44)
AST: 27 U/L (ref 15–41)
Albumin: 3.9 g/dL (ref 3.5–5.0)
Alkaline Phosphatase: 97 U/L (ref 38–126)
Anion gap: 11 (ref 5–15)
BUN: 10 mg/dL (ref 8–23)
CO2: 18 mmol/L — ABNORMAL LOW (ref 22–32)
Calcium: 8.5 mg/dL — ABNORMAL LOW (ref 8.9–10.3)
Chloride: 108 mmol/L (ref 98–111)
Creatinine, Ser: 1.04 mg/dL (ref 0.61–1.24)
GFR calc Af Amer: >60 mL/min (ref >60)
GFR calc non Af Amer: >60 mL/min (ref >60)
Glucose, Bld: 92 mg/dL (ref 70–99)
Potassium: 3.6 mmol/L (ref 3.5–5.1)
Sodium: 137 mmol/L (ref 135–145)
Total Bilirubin: 0.9 mg/dL (ref 0.3–1.2)
Total Protein: 6.9 g/dL (ref 6.5–8.1)

## 2017-11-29 LAB — PROTIME-INR
INR: 1.17
Prothrombin Time: 14.8 seconds (ref 11.4–15.2)

## 2017-11-29 LAB — I-STAT TROPONIN, ED: Troponin i, poc: 0 ng/mL (ref 0.00–0.08)

## 2017-11-29 LAB — ETHANOL: Alcohol, Ethyl (B): <10 mg/dL (ref <10)

## 2017-11-29 LAB — APTT: aPTT: 24 seconds (ref 24–36)

## 2017-11-29 NOTE — ED Provider Notes (Signed)
[place] EMERGENCY DEPARTMENT Provider Note   CSN: [id number] Arrival date & time: 11/29/17  1830     History   Chief Complaint Chief Complaint  Patient presents with  . Altered Mental Status    HPI James [name] is a 62 y.o. male.  James [name] is a 62 y.o. male with history of CAD, mild renal insufficiency, and recent stroke, who presents to the emergency department via EMS for evaluation of altered mental status.  The patient's girlfriend reported to EMS that last known well time was 8 AM today, she reports patient has been intermittently confused.  Patient was admitted to the hospital on 9/3 with confusion, mild dysarthria and mild right-sided weakness, and found to have a left PCA stroke.  Patient had a complete stroke evaluation including echo which showed EF of 45-50%.  After 2-day hospital stay patient left AMA.  Returns today with confusion, patient does not remember initially having a stroke last week.  He reports his right side has decreased sensation and feels slightly weak.  He also reports right-sided visual field deficit in the right eye.  On chart review these are similar deficits to patient's mentation last week.  Patient is able to provide limited history, he is oriented to person and place.  No family is present at the bedside.  He denies any other focal symptoms, no fevers, no chest pain, shortness of breath, no abdominal pain.  He denies nausea, vomiting.  He denies lightheadedness or syncope.  Denies headache, no speech changes or swallowing difficulty.  The history is provided by the patient and the EMS personnel.    Past Medical History:  Diagnosis Date  . Heart disease    patient reports 2 stents ini his heart "years ago"    Patient Active Problem List   Diagnosis Date Noted  . Acute encephalopathy 11/24/2017  . Mild renal insufficiency 11/24/2017  . History of CVA (cerebrovascular accident) 11/24/2017  . Parotid swelling 11/24/2017      Past Surgical History:  Procedure Laterality Date  . TEE WITHOUT CARDIOVERSION N/A 11/25/2017   Procedure: TRANSESOPHAGEAL ECHOCARDIOGRAM (TEE) Bubble Study;  Surgeon: Jolaine Artist, MD;  Location: [facility] ENDOSCOPY;  Service: Cardiovascular;  Laterality: N/A;        Home Medications    Prior to Admission medications   Medication Sig Start Date End Date Taking? Authorizing Provider  aspirin 325 MG tablet Take 1 tablet (325 mg total) by mouth daily. 11/26/17   Domenic Polite, MD  atorvastatin (LIPITOR) 80 MG tablet Take 1 tablet (80 mg total) by mouth daily at 6 PM. 11/25/17   Domenic Polite, MD    Family History No family history on file.  Social History Social History   Tobacco Use  . Smoking status: Current Some Day Smoker  . Smokeless tobacco: Never Used  Substance Use Topics  . Alcohol use: Never    Frequency: Never  . Drug use: Never     Allergies   Patient has no known allergies.   Review of Systems Review of Systems  Constitutional: Negative for chills and fever.  HENT: Negative.   Eyes: Positive for visual disturbance. Negative for pain and redness.  Respiratory: Negative for cough and shortness of breath.   Cardiovascular: Negative for chest pain, palpitations and leg swelling.  Gastrointestinal: Negative for abdominal pain, nausea and vomiting.  Genitourinary: Negative for dysuria and frequency.  Musculoskeletal: Negative for arthralgias, back pain and neck pain.  Skin: Negative for color change and  rash.  Neurological: Positive for weakness and numbness. Negative for dizziness, seizures, facial asymmetry, speech difficulty, light-headedness and headaches.     Physical Exam Updated Vital Signs BP 129/83   Pulse 78   Temp 98.8 F (37.1 C) (Oral)   Resp 11   Ht 5' 11.5" (1.816 m)   SpO2 96%   BMI 28.53 kg/m   Physical Exam  Constitutional: He appears well-developed and well-nourished. No distress.  Patient is slightly confused, but is in no  acute distress  HENT:  Head: Normocephalic and atraumatic.  Mouth/Throat: Oropharynx is clear and moist.  Eyes: Pupils are equal, round, and reactive to light. EOM are normal. Right eye exhibits no discharge. Left eye exhibits no discharge.  Patient reports right-sided visual field cut to the right eye, all fields intact on the left.  No central vision loss.  No nystagmus, PERRLA and EOMI  Neck: Normal range of motion. Neck supple.  C-spine nontender to palpation with normal range of motion  Cardiovascular: Normal rate, regular rhythm, normal heart sounds and intact distal pulses. Exam reveals no gallop and no friction rub.  No murmur heard. Pulmonary/Chest: Effort normal and breath sounds normal. No respiratory distress.  Respirations equal and unlabored, patient able to speak in full sentences, lungs clear to auscultation bilaterally  Abdominal: Soft. Bowel sounds are normal. He exhibits no distension and no mass. There is no tenderness. There is no guarding.  Abdomen soft, nondistended, nontender to palpation in all quadrants without guarding or peritoneal signs  Musculoskeletal: He exhibits no edema or deformity.  Neurological: He is alert. Coordination normal.  Neurological Exam:  Mental Status: Alert and oriented to person and place. Attention and concentration normal. Speech clear. Follows commands. Cranial Nerves: Visual field deficeit reports on the right eye in the right lower quadrant. EOMI and PERRLA. No nystagmus noted. Facial sensation intact at forehead, maxillary cheek, and chin/mandible but reported and is slightly decreased on the right. No facial asymmetry or weakness. Hearing grossly normal. Uvula is midline, and palate elevates symmetrically. Normal SCM and trapezius strength. Tongue midline without fasciculations. Motor: Muscle strength 5/5 in proximal and distal UE and LE bilaterally but patient has difficulty holding up the right arm and leg in comparison to the left for  prolonged amounts of time. No pronator drift. Muscle tone normal. Reflexes: 2+ and symmetrical in all four extremities.  Sensation: Intact to light touch in upper and lower extremities distally bilaterally, reported is slightly decreased in right arm and right leg when compared to the left Gait: Normal without ataxia. Coordination: Normal FTN bilaterally.  Skin: Skin is warm and dry. Capillary refill takes less than 2 seconds. He is not diaphoretic.  Psychiatric: He has a normal mood and affect. His behavior is normal.  Nursing note and vitals reviewed.    ED Treatments / Results  Labs (all labs ordered are listed, but only abnormal results are displayed) Labs Reviewed  COMPREHENSIVE METABOLIC PANEL - Abnormal; Notable for the following components:      Result Value   CO2 18 (*)    Calcium 8.5 (*)    All other components within normal limits  I-STAT CHEM 8, ED - Abnormal; Notable for the following components:   Calcium, Ion 1.14 (*)    All other components within normal limits  ETHANOL  PROTIME-INR  APTT  CBC  DIFFERENTIAL  RAPID URINE DRUG SCREEN, HOSP PERFORMED  URINALYSIS, ROUTINE W REFLEX MICROSCOPIC  I-STAT TROPONIN, ED    EKG EKG Interpretation  Date/Time:  Monday November 29 2017 18:37:39 EDT Ventricular Rate:  88 PR Interval:    QRS Duration: 98 QT Interval:  367 QTC Calculation: 444 R Axis:   -23 Text Interpretation:  Sinus rhythm Borderline left axis deviation Borderline T wave abnormalities When compared to prior, similar S1Q3T3.  no STEMI Confirmed by Antony Blackbird [phone]) on 11/29/2017 8:18:55 PM   Radiology Dg Chest 2 View  Result Date: 11/29/2017 CLINICAL DATA:  Altered mental status EXAM: CHEST - 2 VIEW COMPARISON:  None. FINDINGS: Normal heart size. Lungs clear. No pneumothorax. No pleural effusion. IMPRESSION: No active cardiopulmonary disease. Electronically Signed   By: Marybelle Killings M.D.   On: 11/29/2017 19:48   Mr Brain Wo Contrast  Result  Date: 11/29/2017 CLINICAL DATA:  Initial evaluation for acute stroke, recent lung the left AMA. EXAM: MRI HEAD WITHOUT CONTRAST TECHNIQUE: Multiplanar, multiecho pulse sequences of the brain and surrounding structures were obtained without intravenous contrast. COMPARISON:  Recent MRI from 11/24/17. FINDINGS: Brain: Previously identified patchy left PCA territory infarcts involving the left splenium and thalamus as well as the parasagittal left temporal occipital region again seen, overall relatively stable in size and distribution as compared to previous MRI. There has been normal expected interval evolution with the infarcts now early subacute in appearance. No associated hemorrhage or significant mass effect. No other evidence for acute or interval infarct. No acute intracranial hemorrhage. Underlying atrophy with chronic small vessel ischemic disease again noted. No mass lesion, midline shift or mass effect. No hydrocephalus. No extra-axial fluid collection. Normal pituitary gland. Vascular: Major intracranial vascular flow voids maintained Skull and upper cervical spine: Craniocervical junction normal. Upper cervical spine within normal limits. No focal marrow replacing lesion. Scalp soft tissues unremarkable. Sinuses/Orbits: Globes and orbital soft tissues within normal limits. Mild scattered mucosal thickening throughout the paranasal sinuses. No mastoid effusion. Inner ear structures normal. Other: None. IMPRESSION: 1. Patchy multifocal early subacute left PCA territory infarcts, relatively stable in size and distribution as compared to recent MRI. No evidence for hemorrhagic transformation or other complication. 2. No other new acute intracranial abnormality. 3. Underlying atrophy with chronic small vessel ischemic disease, stable. Electronically Signed   By: Jeannine Boga M.D.   On: 11/29/2017 22:05    Procedures Procedures (including critical care time)  Medications Ordered in ED Medications  - No data to display   Initial Impression / Assessment and Plan / ED Course  I have reviewed the triage vital signs and the nursing notes.  Pertinent labs & imaging results that were available during my care of the patient were reviewed by me and considered in my medical decision making (see chart for details).  Pt arrives with EMS for evaluation of intermittent confusion starting in a.m. today.  On arrival vitals are stable and patient is in no acute distress.  Initially he does not recall recent stroke last week, is oriented to person and place but not time.  Reports right-sided right visual field cut, also complaining of right-sided decreased sensation and subjective weakness.  On chart review these are similar to deficits noted on patient's presentation last week on 9/3 when patient was diagnosed with left PCA infarct.  Patient left AMA after 2 days of hospitalization.  Although patient is VAN positive with his symptoms today, the symptoms are almost identical to those with his presentation last week and I suspect this is just expression of his known deficit.  Will discuss with neurology prior to initiating code stroke.  Concern for  hemorrhagic conversion or complications surrounding prior infarct.  6:55 PM discussed with Dr. Malen Gauze with neurology regarding symptoms which seem consistent with his prior stroke, patient seen on 9/3 and found to have a left PCA infarct.  Presentation today is almost identical to that of yesterday with confusion, dysarthria mild weakness and sensory deficits in the right arm and leg.  Symptoms are consistent with prior stroke and patient does should not be an acute code stroke today.  Neurology recommends repeat MRI and lab work-up.    Labs are overall reassuring, no leukocytosis, normal hemoglobin, no acute electrolyte derangements, normal renal function and liver function.  Urinalysis without any evidence of infection, no hematuria or proteinuria, UDS is negative,  ethanol level is negative.  Negative troponin and normal coags.  Chest x-ray is unremarkable.  Awaiting MRI.  Patient has had improvement in his confusion, is aware of the stroke diagnosis he received last week, continues to deny any other symptoms.  MRI shows patchy subacute left PCA infarcts relatively stable in size and distribution compared to MRI from last week there is no evidence for hemorrhagic transformation or other complication of prior infarct and there are no new intracranial abnormalities.  We will discuss these results with neurology.  10:31 PM Case discussed with Dr. Cheral Marker with neurology, MRI shows no new stroke, no hemorrhagic conversion or other new complication with prior infarct.  He agrees that the symptoms patient is experiencing are in line with prior deficit.  He encourages making sure the patient remains well-hydrated, as his lower blood pressures can increase expression of deficits.  Feels patient is stable for discharge home with outpatient follow-up and continuation of outpatient management since patient had stroke evaluation during prior admission.  Discussed this with the patient and he is in agreement, his girlfriend is coming to pick him up.  Patient is alert and oriented with clear speech, and is able to ambulate with steady gait.  Encouraged oral hydration, and instructed to continue with aspirin and Lipitor as directed.  Return precautions discussed.  Patient expresses understanding and is in agreement with this plan.   At this time there does not appear to be any evidence of an acute emergency medical condition and the patient appears stable for discharge with appropriate outpatient follow up.Diagnosis was discussed with patient who verbalizes understanding and is agreeable to discharge. Pt case discussed with Dr. Sherry Ruffing who agrees with my plan.      Final Clinical Impressions(s) / ED Diagnoses   Final diagnoses:  Cerebrovascular accident (CVA), unspecified  mechanism [facility])    ED Discharge Orders    None       Jacqlyn Larsen, [place] 11/30/17 1120    Tegeler, Gwenyth Allegra, MD 11/30/17 2244

## 2017-11-29 NOTE — ED Notes (Signed)
Patient transported to X-ray 

## 2017-11-29 NOTE — Discharge Instructions (Signed)
Your MRI today shows no changes in your stroke, the symptoms you are experiencing are due to this and your labs are overall normal.  Make sure you are drinking plenty of water and staying hydrated, dehydration can worsen the symptoms from your prior stroke.  Continue with aspirin and atorvastatin and follow-up with your primary care doctor.  Return for any new neurologic symptoms, severe headache, vomiting, dizziness, vision changes or any other new or concerning symptoms.

## 2017-11-29 NOTE — ED Notes (Signed)
ED Provider at bedside. 

## 2017-11-29 NOTE — ED Triage Notes (Signed)
Pt to ED via EMS from home. Stroke last week and left AMA and girlfriend reports he still "has a clot." Heart attack a month ago, intermittent confusion today. LKW 0800 today. Pt remembers nothing of stroke last week.

## 2017-11-29 NOTE — ED Notes (Signed)
Patient transported to MRI 

## 2017-12-06 ENCOUNTER — Ambulatory Visit (INDEPENDENT_AMBULATORY_CARE_PROVIDER_SITE_OTHER): Payer: Self-pay

## 2017-12-06 ENCOUNTER — Other Ambulatory Visit: Payer: Self-pay | Admitting: Physician Assistant

## 2017-12-06 DIAGNOSIS — I639 Cerebral infarction, unspecified: Secondary | ICD-10-CM

## 2017-12-06 DIAGNOSIS — I4891 Unspecified atrial fibrillation: Secondary | ICD-10-CM

## 2017-12-06 DIAGNOSIS — I63039 Cerebral infarction due to thrombosis of unspecified carotid artery: Secondary | ICD-10-CM

## 2017-12-08 NOTE — Discharge Summary (Addendum)
Physician Discharge Summary  James [name] NID:[id number] DOB: 04-03-1955 DOA: 11/23/2017  PCP: Patient, No Pcp Per  Admit date: 11/23/2017 Discharge date: 11/25/2017  Time spent: 45 minutes  Recommendations for Outpatient Follow-up:  PT LEFT AMA Outpatient neurology in one month   Discharge Diagnoses:  Principal Problem:   Acute encephalopathy   Multiple subacute strokes   Suspected vascular dementia   Cardiomyopathy   History of CAD   Mild renal insufficiency   History of CVA (cerebrovascular accident)   Parotid swelling    Filed Weights   11/23/17 2108 11/24/17 0215  Weight: 72.6 kg 94.1 kg    History of present illness:  62 year old male who does not seek regular medical care, remote history of CAD and stents from 5 years ago in ER, was brought to the emergency room by his girlfriend for evaluation of ongoing confusion, fine motor weakness in coordination for approximately 1 week -CT in the emergency room noted subacute infarcts in the medial left temporal lobe and left occipital lobe, left thalamus with atrophy and mild small vessel white matter changes.in th  Hospital Course:   1. Subacute stroke -MRI noted patchy left PCA infarcts involving the left occiput, thalamus, splenium -With resultant mild right hemiplegia and cognitive, memory deficits -Neurology consulted, TEE recommended to look for embolic source -Carotid duplex at 1-39% stenosis, 2-D echocardiogram shows EF of 45% -LDL is 143, started statin -Hemoglobin A1c is 5.8 consistent with borderline diabetes, last modification recommended -PT OT, SLP evaluations ongoing, patient less inclined to go to rehabilitation -patient had originally agreed to TEE, subsequently when cardiology PA discussed further he refused this, finally agreeable again after extensive discussion per Dr. Leonie Man and myself, this was completed, did not show evidence of thrombus however noted a small PFO, I ordered stat venous Dopplers to rule  out DVT, however patient was adamant to leave the hospital and left [place] before this could be completed -adamantly declined rehabilitation and CIR  2. Cardiomyopathy -EF 45%, likely ischemic -Per girlfriend history of MI about 4-5 years ago in in [place], had angioplasty and 2 stents placed -Continue aspirin, statin, started low-dose beta blocker -needs cardiology follow-up  3. Mild Renal insufficiency -resolved with hydration  3. Right parotid swelling -recommend nonurgent ENT follow-up for this  4. Prior CAD -history of MI and 2 stents, in [place] approximately 4-5 years ago -EF 45% -see above   Procedures:  Transesophageal echocardiogram: normal EF, no thrombus, small PFO  Consultations:  Neurology  And cardiology for TEE  Discharge Exam: Vitals:   11/25/17 1515 11/25/17 1629  BP: 115/75 (!) 158/102  Pulse: 79 84  Resp: 12 16  Temp:  98.4 F (36.9 C)  SpO2: 97% 99%    General: AAOx2 Cardiovascular:S1S2/RRR Respiratory: CTAB  Discharge Instructions   Discharge Instructions    Diet - low sodium heart healthy   Complete by:  As directed    Diet Carb Modified   Complete by:  As directed    Increase activity slowly   Complete by:  As directed      Allergies as of 11/25/2017   No Known Allergies     Medication List    TAKE these medications   aspirin 325 MG tablet Take 1 tablet (325 mg total) by mouth daily.   atorvastatin 80 MG tablet Commonly known as:  LIPITOR Take 1 tablet (80 mg total) by mouth daily at 6 PM.      No Known Allergies Follow-up Information  [facility] Follow up on 12/14/2017.   Why:  1:30 pm Contact information: [place] [address] [phone]       [facility] Follow up on 12/09/2017.   Specialty:  Cardiology Why:  9:00AM, heart monitor hook up Contact information: [address], [address]  [place]       Baldwin Jamaica, PA-C Follow up on 01/05/2018.   Specialty:  Cardiology Why:  9:00AM Contact information: [place] [address] [phone]        Garvin Fila, MD. Go in 1 month(s).   Specialties:  Neurology, Radiology Contact information: [address] [place] [address] [phone]            The results of significant diagnostics from this hospitalization (including imaging, microbiology, ancillary and laboratory) are listed below for reference.    Significant Diagnostic Studies: Dg Chest 2 View  Result Date: 11/29/2017 CLINICAL DATA:  Altered mental status EXAM: CHEST - 2 VIEW COMPARISON:  None. FINDINGS: Normal heart size. Lungs clear. No pneumothorax. No pleural effusion. IMPRESSION: No active cardiopulmonary disease. Electronically Signed   By: Marybelle Killings M.D.   On: 11/29/2017 19:48   Ct Head Wo Contrast  Result Date: 11/23/2017 CLINICAL DATA:  Ataxia EXAM: CT HEAD WITHOUT CONTRAST TECHNIQUE: Contiguous axial images were obtained from the base of the skull through the vertex without intravenous contrast. COMPARISON:  None. FINDINGS: Brain: No hemorrhage or intracranial mass is visualized. Mild atrophy. Mild small vessel ischemic changes of the white matter. Probable chronic left thalamic infarct and small suspected chronic left occipital infarct. Age indeterminate infarct within the medial left temporal lobe. Ventricles are nonenlarged. Vascular: No hyperdense vessels. Vertebral artery and carotid vascular calcification Skull: No fracture Sinuses/Orbits: Mucosal thickening in the ethmoid and maxillary sinuses. Other: None IMPRESSION: 1. Age indeterminate infarct within the medial left temporal lobe. Small infarcts in the left occipital lobe and left thalamus, probably chronic. 2. Atrophy with mild small vessel ischemic changes of the white matter. Electronically Signed    By: Donavan Foil M.D.   On: 11/23/2017 22:48   Mr Jodene Nam Head Wo Contrast  Result Date: 11/24/2017 CLINICAL DATA:  Initial evaluation for acute altered mental status. EXAM: MRI HEAD WITHOUT CONTRAST MRA HEAD WITHOUT CONTRAST TECHNIQUE: Multiplanar, multiecho pulse sequences of the brain and surrounding structures were obtained without intravenous contrast. Angiographic images of the head were obtained using MRA technique without contrast. COMPARISON:  Comparison made with prior CT from 11/23/2017 FINDINGS: MRI HEAD FINDINGS Brain: Diffuse prominence of the CSF containing spaces compatible with generalized cerebral atrophy. Patchy and confluent T2/FLAIR hyperintensity within the periventricular deep white matter both cerebral hemispheres most compatible with chronic small vessel ischemic disease, moderate nature. Chronic microvascular ischemic changes present within the pons. Patchy multifocal areas of restricted diffusion seen involving the parasagittal left temporal and occipital lobes, left thalamus, and left splenium, consistent with acute left PCA territory infarcts. Slight extension into the left cerebral peduncle and left midbrain. No associated hemorrhage or mass effect. No other evidence for acute or subacute ischemia. Gray-white matter differentiation otherwise maintained. No evidence for acute or chronic intracranial hemorrhage. No mass lesion, midline shift or mass effect. No hydrocephalus. No extra-axial fluid collection. Pituitary gland normal. Vascular: Major intracranial vascular flow voids maintained. Skull and upper cervical spine: Craniocervical junction within normal limits. Bone marrow signal intensity somewhat diffusely  decreased on T1 weighted imaging, most commonly related to anemia, smoking, or obesity. No focal marrow replacing lesion. No scalp soft tissue abnormality. Sinuses/Orbits: Globes and orbital soft tissues within normal limits. Scattered mucosal thickening throughout the  paranasal sinuses. Superimposed left maxillary sinus retention cyst noted. Trace opacity left mastoid air cells. Other: 3.4 cm lobulated well-circumscribed T2 hypointense lesion within the right parotid gland (series 19, image 17). Additional probable lesion within the left parotid gland measures 13 mm (series 19, image 17). MRA HEAD FINDINGS ANTERIOR CIRCULATION: Examination mildly degraded by motion artifact. Distal cervical segments of the internal carotid arteries are widely patent with antegrade flow. Petrous segments patent bilaterally. Mild atheromatous irregularity within the cavernous/supraclinoid ICAs without hemodynamically significant stenosis. A1 segments, anterior communicating artery common anterior cerebral arteries widely patent bilaterally. M1 segments patent without stenosis. No proximal M2 occlusion. Distal MCA branches well perfused and symmetric. Mild distal small vessel atheromatous irregularity. POSTERIOR CIRCULATION: Vertebral arteries code dominant and patent to the vertebrobasilar junction without stenosis. Posterior inferior cerebral arteries patent proximally. Basilar widely patent to its distal aspect. Superior cerebral arteries patent bilaterally. Both of the posterior cerebral artery supplied via the basilar. Right PCA widely patent to its distal aspect. There is abrupt occlusion of the proximal left PCA, left P2 segment. No intracranial aneurysm. IMPRESSION: MRI HEAD IMPRESSION: 1. Patchy multifocal left PCA territory infarcts involving the parasagittal left temporal occipital region, left thalamus, and left splenium. No associated hemorrhage or mass effect. 2. Atrophy with moderate chronic microvascular ischemic disease. 3. 3.4 cm mass within the right parotid gland, with additional probable 13 mm left parotid lesion. Findings are nonspecific. Follow-up with nonemergent ENT consultation and referral on an outpatient basis could be performed for further evaluation as clinically  warranted. MRA HEAD IMPRESSION: 1. Acute proximal left P2 occlusion. 2. Major intracranial arterial vasculature otherwise widely patent with no hemodynamically significant or correctable stenosis identified. 3. Mild distal small vessel atheromatous irregularity. Electronically Signed   By: Jeannine Boga M.D.   On: 11/24/2017 06:20   Mr Brain Wo Contrast  Result Date: 11/29/2017 CLINICAL DATA:  Initial evaluation for acute stroke, recent lung the left AMA. EXAM: MRI HEAD WITHOUT CONTRAST TECHNIQUE: Multiplanar, multiecho pulse sequences of the brain and surrounding structures were obtained without intravenous contrast. COMPARISON:  Recent MRI from 11/24/17. FINDINGS: Brain: Previously identified patchy left PCA territory infarcts involving the left splenium and thalamus as well as the parasagittal left temporal occipital region again seen, overall relatively stable in size and distribution as compared to previous MRI. There has been normal expected interval evolution with the infarcts now early subacute in appearance. No associated hemorrhage or significant mass effect. No other evidence for acute or interval infarct. No acute intracranial hemorrhage. Underlying atrophy with chronic small vessel ischemic disease again noted. No mass lesion, midline shift or mass effect. No hydrocephalus. No extra-axial fluid collection. Normal pituitary gland. Vascular: Major intracranial vascular flow voids maintained Skull and upper cervical spine: Craniocervical junction normal. Upper cervical spine within normal limits. No focal marrow replacing lesion. Scalp soft tissues unremarkable. Sinuses/Orbits: Globes and orbital soft tissues within normal limits. Mild scattered mucosal thickening throughout the paranasal sinuses. No mastoid effusion. Inner ear structures normal. Other: None. IMPRESSION: 1. Patchy multifocal early subacute left PCA territory infarcts, relatively stable in size and distribution as compared to  recent MRI. No evidence for hemorrhagic transformation or other complication. 2. No other new acute intracranial abnormality. 3. Underlying atrophy with chronic small vessel ischemic disease, stable.  Electronically Signed   By: Jeannine Boga M.D.   On: 11/29/2017 22:05   Mr Brain Wo Contrast  Result Date: 11/24/2017 CLINICAL DATA:  Initial evaluation for acute altered mental status. EXAM: MRI HEAD WITHOUT CONTRAST MRA HEAD WITHOUT CONTRAST TECHNIQUE: Multiplanar, multiecho pulse sequences of the brain and surrounding structures were obtained without intravenous contrast. Angiographic images of the head were obtained using MRA technique without contrast. COMPARISON:  Comparison made with prior CT from 11/23/2017 FINDINGS: MRI HEAD FINDINGS Brain: Diffuse prominence of the CSF containing spaces compatible with generalized cerebral atrophy. Patchy and confluent T2/FLAIR hyperintensity within the periventricular deep white matter both cerebral hemispheres most compatible with chronic small vessel ischemic disease, moderate nature. Chronic microvascular ischemic changes present within the pons. Patchy multifocal areas of restricted diffusion seen involving the parasagittal left temporal and occipital lobes, left thalamus, and left splenium, consistent with acute left PCA territory infarcts. Slight extension into the left cerebral peduncle and left midbrain. No associated hemorrhage or mass effect. No other evidence for acute or subacute ischemia. Gray-white matter differentiation otherwise maintained. No evidence for acute or chronic intracranial hemorrhage. No mass lesion, midline shift or mass effect. No hydrocephalus. No extra-axial fluid collection. Pituitary gland normal. Vascular: Major intracranial vascular flow voids maintained. Skull and upper cervical spine: Craniocervical junction within normal limits. Bone marrow signal intensity somewhat diffusely decreased on T1 weighted imaging, most commonly  related to anemia, smoking, or obesity. No focal marrow replacing lesion. No scalp soft tissue abnormality. Sinuses/Orbits: Globes and orbital soft tissues within normal limits. Scattered mucosal thickening throughout the paranasal sinuses. Superimposed left maxillary sinus retention cyst noted. Trace opacity left mastoid air cells. Other: 3.4 cm lobulated well-circumscribed T2 hypointense lesion within the right parotid gland (series 19, image 17). Additional probable lesion within the left parotid gland measures 13 mm (series 19, image 17). MRA HEAD FINDINGS ANTERIOR CIRCULATION: Examination mildly degraded by motion artifact. Distal cervical segments of the internal carotid arteries are widely patent with antegrade flow. Petrous segments patent bilaterally. Mild atheromatous irregularity within the cavernous/supraclinoid ICAs without hemodynamically significant stenosis. A1 segments, anterior communicating artery common anterior cerebral arteries widely patent bilaterally. M1 segments patent without stenosis. No proximal M2 occlusion. Distal MCA branches well perfused and symmetric. Mild distal small vessel atheromatous irregularity. POSTERIOR CIRCULATION: Vertebral arteries code dominant and patent to the vertebrobasilar junction without stenosis. Posterior inferior cerebral arteries patent proximally. Basilar widely patent to its distal aspect. Superior cerebral arteries patent bilaterally. Both of the posterior cerebral artery supplied via the basilar. Right PCA widely patent to its distal aspect. There is abrupt occlusion of the proximal left PCA, left P2 segment. No intracranial aneurysm. IMPRESSION: MRI HEAD IMPRESSION: 1. Patchy multifocal left PCA territory infarcts involving the parasagittal left temporal occipital region, left thalamus, and left splenium. No associated hemorrhage or mass effect. 2. Atrophy with moderate chronic microvascular ischemic disease. 3. 3.4 cm mass within the right parotid  gland, with additional probable 13 mm left parotid lesion. Findings are nonspecific. Follow-up with nonemergent ENT consultation and referral on an outpatient basis could be performed for further evaluation as clinically warranted. MRA HEAD IMPRESSION: 1. Acute proximal left P2 occlusion. 2. Major intracranial arterial vasculature otherwise widely patent with no hemodynamically significant or correctable stenosis identified. 3. Mild distal small vessel atheromatous irregularity. Electronically Signed   By: Jeannine Boga M.D.   On: 11/24/2017 06:20    Microbiology: No results found for this or any previous visit (from the past 240 hour(s)).  Labs: Basic Metabolic Panel: No results for input(s): NA, K, CL, CO2, GLUCOSE, BUN, CREATININE, CALCIUM, MG, PHOS in the last 168 hours. Liver Function Tests: No results for input(s): AST, ALT, ALKPHOS, BILITOT, PROT, ALBUMIN in the last 168 hours. No results for input(s): LIPASE, AMYLASE in the last 168 hours. No results for input(s): AMMONIA in the last 168 hours. CBC: No results for input(s): WBC, NEUTROABS, HGB, HCT, MCV, PLT in the last 168 hours. Cardiac Enzymes: No results for input(s): CKTOTAL, CKMB, CKMBINDEX, TROPONINI in the last 168 hours. BNP: BNP (last 3 results) No results for input(s): BNP in the last 8760 hours.  ProBNP (last 3 results) No results for input(s): PROBNP in the last 8760 hours.  CBG: No results for input(s): GLUCAP in the last 168 hours.     Signed:  Domenic Polite MD.  [facility] 12/08/2017, 4:02 PM

## 2017-12-10 ENCOUNTER — Telehealth: Payer: Self-pay

## 2017-12-10 NOTE — Telephone Encounter (Signed)
Spoke with patient's wife, she stated she called [employer] and they asked about the rhythm strips. The patient had no symptoms during the events. Two events occurred on 9/19 at 3:17 pm and 9:43 pm. 5 beats of V-tach and 8 V-tach respectively. Dr. Tamala Julian (DOD) reviewed the rhythms and had no concerns.

## 2017-12-14 ENCOUNTER — Ambulatory Visit: Payer: Self-pay | Attending: Family Medicine | Admitting: Family Medicine

## 2017-12-14 ENCOUNTER — Encounter: Payer: Self-pay | Admitting: Family Medicine

## 2017-12-14 VITALS — BP 126/97 | HR 94 | Temp 98.4°F | Resp 17 | Ht 66.0 in | Wt 204.0 lb

## 2017-12-14 DIAGNOSIS — Z955 Presence of coronary angioplasty implant and graft: Secondary | ICD-10-CM | POA: Insufficient documentation

## 2017-12-14 DIAGNOSIS — F17210 Nicotine dependence, cigarettes, uncomplicated: Secondary | ICD-10-CM | POA: Insufficient documentation

## 2017-12-14 DIAGNOSIS — I2510 Atherosclerotic heart disease of native coronary artery without angina pectoris: Secondary | ICD-10-CM | POA: Insufficient documentation

## 2017-12-14 DIAGNOSIS — R410 Disorientation, unspecified: Secondary | ICD-10-CM | POA: Insufficient documentation

## 2017-12-14 DIAGNOSIS — I252 Old myocardial infarction: Secondary | ICD-10-CM | POA: Insufficient documentation

## 2017-12-14 DIAGNOSIS — R609 Edema, unspecified: Secondary | ICD-10-CM

## 2017-12-14 DIAGNOSIS — I69351 Hemiplegia and hemiparesis following cerebral infarction affecting right dominant side: Secondary | ICD-10-CM | POA: Insufficient documentation

## 2017-12-14 DIAGNOSIS — I639 Cerebral infarction, unspecified: Secondary | ICD-10-CM

## 2017-12-14 DIAGNOSIS — R531 Weakness: Secondary | ICD-10-CM | POA: Insufficient documentation

## 2017-12-14 DIAGNOSIS — R7303 Prediabetes: Secondary | ICD-10-CM

## 2017-12-14 DIAGNOSIS — E785 Hyperlipidemia, unspecified: Secondary | ICD-10-CM

## 2017-12-14 MED ORDER — ATORVASTATIN CALCIUM 80 MG PO TABS: 80.0000 mg | ORAL_TABLET | Freq: Every day | ORAL | 5 refills | Status: DC

## 2017-12-14 NOTE — Progress Notes (Signed)
Subjective:    Patient ID: James [name], male    DOB: [birth year], 62 y.o.   MRN: [id number]  HPI 62 year old male new to the practice who is status post hospital admission on 12/20/2017 secondary to onset of right-sided weakness and patient was diagnosed with a left PCA stroke patient however 1 of the hospital AMA on 11/25/2017.  Patient returned to the emergency department on 11/29/2017 secondary to complaints of possible altered mental status as well as some right-sided weakness and abnormality of the right field.  Patient was discharged home per medical records as it was felt that all of his symptoms were related to his initial stroke.  Patient presents to establish ongoing care of chronic medical issues.  Patient denies any current issues with visual field disturbance at today's visit.  Patient does not feel that he has any significant right-sided weakness.  Patient however does have an abnormal sensation along his right upper arm and forearm consisting of the numbness and tingling sensation as if his arm has fallen asleep.  Patient states that this is not painful.  Patient also feels that when he touches his right arm it is warmer compared to the left arm.  Patient has seen cardiology and currently has a cardiac monitor.  Patient states that he is taking daily aspirin as well as cholesterol medication prescribed at the time of hospital discharge the patient is not sure what he needs to take such a high dose of aspirin.  Patient does have past history of heart disease as well.  Patient reports a history of prior MI/heart attack about 4 to 5 years ago for which he had placement of 2 stents.  Patient denies any recent chest pain.        Patient reports that he still has some swelling but no pain in the area below his right ear.  Patient states that this was present at the time of his initial hospital admission.  Patient states that he is not sure how long the swelling has been present.  Patient denies any  issues with dry mouth/decreased saliva production.  Patient reports that he does continue to smoke cigarettes anywhere from 1/2 to 1 pack/day depending on his stress level.  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  Patient states that his mother had heart problems but she lived until the age of 18.  Patient's father died at the age of 28 due to complications from alcohol abuse.  Patient reports that he will retire in a few months when he is 28 but at one point said that he was already retired. Past Medical History:  Diagnosis Date  . Heart disease    patient reports 2 stents ini his heart "years ago"   Past Surgical History:  Procedure Laterality Date  . TEE WITHOUT CARDIOVERSION N/A 11/25/2017   Procedure: TRANSESOPHAGEAL ECHOCARDIOGRAM (TEE) Bubble Study;  Surgeon: Jolaine Artist, MD;  Location: [facility] ENDOSCOPY;  Service: Cardiovascular;  Laterality: N/A;   Social History   Tobacco Use  . Smoking status: Current Some Day Smoker  . Smokeless tobacco: Never Used  Substance Use Topics  . Alcohol use: Never    Frequency: Never  . Drug use: Never  No Known Allergies   Review of Systems  Constitutional: Negative for chills and fever.  HENT: Positive for facial swelling. Negative for trouble swallowing.   Respiratory: Negative for cough  and shortness of breath.   Cardiovascular: Negative for chest pain, palpitations and leg swelling.  Gastrointestinal: Negative for abdominal pain, blood in stool and nausea.  Genitourinary: Negative for dysuria and frequency.  Musculoskeletal: Negative for arthralgias and gait problem.  Neurological: Positive for numbness. Negative for dizziness and light-headedness.       Objective:   Physical Exam BP (!) 126/97   Pulse 94   Temp 98.4 F (36.9 C) (Oral)   Resp 17   Ht 5\' 6"  (1.676 m)   Wt 204 lb (92.5 kg)   SpO2 100%   BMI 32.93 kg/m Nurse's notes  and vital signs reviewed General- well-nourished, well-developed older male in no acute distress.  Patient is accompanied by his daughter at today's visit EENT- conjunctiva normal, extraocular movements intact.  TMs gray, mild edema of the nasal turbinates, normal posterior oropharynx.  Patient does have palpable swelling that is slightly boggy over the right parotid gland, slightly below the ear at the angle of the jaw/mandible Neck-supple, no lymphadenopathy, no thyromegaly, no carotid bruit Cardiovascular- regular rate and rhythm, patient with occasional ectopic beat Chest- patient appears to have a implantable cardiac loop recorder on his mid upper chest Lungs-clear to auscultation bilaterally Back-no CVA tenderness Neuro- cranial nerves II through XII are grossly intact.  Patient with 5 out of 5 strength in the upper and lower extremities.  Patient does have complaint of abnormal sensation to the right arm versus left arm with palpation Extremities-no edema Psych- patient with a short attention span, patient was inpatient during the exam but could be distracted with humor.  Patient on a few occasions did repeat the exact same questions/statements (per records from his hospital discharge summary it was question if patient might have some cognitive impairment)      Assessment & Plan:  1. Cerebrovascular accident (CVA), unspecified mechanism ([name]) Patient is status post hospitalization on 11/23/2017 for some mild right-sided weakness and issues with confusion and patient was diagnosed with multiple infarcts in the medial left temporal lobe and left occipital lobe left thalamus and left PCA stroke.  Patient however left the hospital AMA on 11/25/2017.  Patient did not have any significant carotid stenosis.  Patient is being evaluated by cardiology with monitor to rule out atrial fibrillation.  Patient did have elevated blood pressure initially in the emergency department per the notes as well as  elevated blood pressure at today's visit but upon recheck, blood pressure was normal.  Patient is not interested in being on blood pressure medication.  Patient's hospital notes also mention possible cognitive/memory deficits due to possible vascular dementia.  Patient does have upcoming appointment with neurology which he is encouraged to keep.  2. Hyperlipidemia, unspecified hyperlipidemia type Hospital records, patient with LDL of 143 and patient was started on high-dose atorvastatin as patient also with history of CAD status post stenting.  Patient was asked to return in 1 month for LFTs and follow-up of his recent start of high-dose statin medication. - atorvastatin (LIPITOR) 80 MG tablet; Take 1 tablet (80 mg total) by mouth daily at 6 PM.  Dispense: 30 tablet; Refill: 5  3. Parotid swelling Patient with swelling in the right parotid gland area for which he will be referred to ENT for further evaluation and treatment - Ambulatory referral to ENT  4. Prediabetes Patient with hemoglobin A1c during his rehabilitation that was slightly abnormal at 5.8 and it was discussed with patient and his daughter at today's visit that this test indicates an  increased risk of patient developing diabetes and patient is encouraged to follow a low carb diet and when cleared to do so by cardiology, he should participate in low impact cardiovascular exercise.  *Patient was offered immunization which he declined.  An After Visit Summary was printed and given to the patient.  Return in about 4 weeks (around 01/11/2018).

## 2017-12-20 ENCOUNTER — Telehealth: Payer: Self-pay

## 2017-12-20 NOTE — Telephone Encounter (Signed)
Per Dr. Saunders Revel, no changes as long as patient is asymptomatic. Patient called back and stated at the time of the event (12/20/17 11:21 am) he was putting on his monitor and he has no symptoms to report. Informed patient that if he has any questions or concerns to give our office a call. Patient verbalized understanding.

## 2017-12-20 NOTE — Telephone Encounter (Signed)
Called and left message for patient to call back. Received monitor form [employer] showing Sinus Tachycardia w/run of V-tach (5, 4, 3 beats)/ Couplet PVCs (9 in 1 min). Will consult Dr. Saunders Revel, DOD.

## 2017-12-21 ENCOUNTER — Telehealth: Payer: Self-pay | Admitting: *Deleted

## 2017-12-21 NOTE — Telephone Encounter (Signed)
[employer] faxed over 2 more serious event monitors on this pt from 12/20/17.   1st event monitor recording was yesterday morning, and this was addressed by Triage RN Pam.   2nd event monitor recording received today from yesterday 9/30 at 2:01 pm, showing the pt to have sinus tach w run of v-tach 5-3 beats/couplets PVCs (9 in 1 min), at a rate of 150 bpm.  3rd event monitor recording received today from yesterday 9/30 at 6:49 pm, showed the pt to be in sinus rhythm w run of v-tach (7 beats)/couplets PVCs (6 in 1 min) at a rate of 140 bpm.   Called the pt to inquire if he was symptomatic or asymptomatic at noted time of events, and he endorsed he was ASYMPTOMATIC all day yesterday.   Showed DOD Dr Radford Pax both recording received on this pt, and she endorsed no new changes, continue to monitor, and follow-up with Dr Ewell Poe Charlcie Cradle PA-C as scheduled for 01/05/18 at 0900.   Endorsed this to the pt.  Pt verbalized understanding and agrees with this plan.  Will send this message to both Dr Rayann Heman and RN, as well as Joseph Art, as an [place]. Monitors will be scanned into the pts chart.

## 2017-12-22 ENCOUNTER — Telehealth: Payer: Self-pay | Admitting: *Deleted

## 2017-12-22 NOTE — Telephone Encounter (Addendum)
[employer] services' strip was received today on pt. "Sinus Rhythm w/Run of V-Tach (5 beats)" This is the 4 th report sent on pt. Called pt he states that he  is doing fine. Pt didn't feel any arrhythmias nor any other   symptoms noted. Pt has an appointment with Dr.Allred/Renee Charlcie Cradle PA on 01/05/18 at 0900 AM. Dr. Marlou Porch aware of report and appointment.

## 2017-12-28 ENCOUNTER — Ambulatory Visit: Payer: MEDICAID | Admitting: Adult Health

## 2017-12-28 NOTE — Progress Notes (Deleted)
[facility] [address] [place]. [place] [address] [phone]       OFFICE FOLLOW UP NOTE  [name] Date of Birth:  10/13/1955 Medical Record Number:  [id number]   Reason for Referral:  hospital stroke follow up  CHIEF COMPLAINT:  No chief complaint on file.   HPI: James [name] is being seen today for initial visit in the office for left PCA infarcts on 11/23/2017. History obtained from *** and chart review. Reviewed all radiology images and labs personally.  [name] is a 62 y.o. male with history of MI with stent placement and tobacco use who presented with confusion, dysarthria, and decreased fine motor movement x 1 week.  CT had reviewed and showed old medial left temporal lobe infarct along with probable old left left occipital and left thalamic infarcts and small vessel disease.  MRI brain reviewed and showed multifocal patchy left PCA infarcts along with right parotid 3.4 cm mass and left parotid 13 mm lesion.  MRA showed proximal left P2 occlusion with mild small vessel disease.  Carotid Doppler showed bilateral ICA stenosis of 1 to 39% with VAs antegrade.  2D echo showed an EF of 40 to 45% along with moderate LVH and systolic function mildly to moderately reduced.  LDL 143 and as patient was not on statin PTA recommended to start Lipitor 80 mg.  A1c 5.8.  Current tobacco user and recommended smoking cessation with counseling provided.  Patient was not on antithrombotic PTA is recommended to start aspirin 325 mg daily for secondary stroke prevention.  Recommended for outpatient follow-up with ENT for findings of parotid mass.  Recommended TEE due to infarct felt to be embolic without clear source. TEE showed an EF of 50 to 55% without evidence of thrombus but did show small PFO.  Due to evidence of PFO, is recommended for lower extremity Dopplers but unfortunately patient left AMA prior to this being completed despite further studies need to be  completed and recommendation for CIR. Patient return to ED on 11/29/2017 for evaluation of altered mental status and per notes, patient did not remember recent stroke admission.  Per notes, he was reporting that his right side has decreased sensation and feels slightly weak along with right-sided visual deficit in the right eye which were similar deficits the patient's complaints during prior admission.  MRI brain reviewed which showed subacute left PCA infarcts which were relatively stable in size and distribution compared to prior MRI without evidence of hemorrhagic transformation or other complication of prior infarct and no evidence of new intracranial abnormalities.  Throughout this admission, patient had improvement in his confusion and became aware of his prior stroke diagnosis he received a week prior and denied any other symptoms.  It was determined that the symptoms he was experiencing were in line with prior deficit and encouraged patient remains well-hydrated with avoidance of hypotension and patient was discharged home in stable condition with recommendations of outpatient therapy.  Patient is being seen today for hospital follow-up.  Cardiac event monitor has been showing frequent V. tach runs with PVCs.   ROS:   14 system review of systems performed and negative with exception of ***  PMH:  Past Medical History:  Diagnosis Date  . Heart disease    patient reports 2 stents ini his heart "years ago"    PSH:  Past Surgical History:  Procedure Laterality Date  . TEE WITHOUT CARDIOVERSION N/A 11/25/2017   Procedure: TRANSESOPHAGEAL ECHOCARDIOGRAM (TEE) Bubble Study;  Surgeon: Jolaine Artist, MD;  Location: [facility] ENDOSCOPY;  Service: Cardiovascular;  Laterality: N/A;    Social History:  Social History   Socioeconomic History  . Marital status: Single    Spouse name: Not on file  . Number of children: Not on file  . Years of education: Not on file  . Highest education level: Not  on file  Occupational History  . Not on file  Social Needs  . Financial resource strain: Not on file  . Food insecurity:    Worry: Not on file    Inability: Not on file  . Transportation needs:    Medical: Not on file    Non-medical: Not on file  Tobacco Use  . Smoking status: Current Some Day Smoker  . Smokeless tobacco: Never Used  Substance and Sexual Activity  . Alcohol use: Never    Frequency: Never  . Drug use: Never  . Sexual activity: Not on file  Lifestyle  . Physical activity:    Days per week: Not on file    Minutes per session: Not on file  . Stress: Not on file  Relationships  . Social connections:    Talks on phone: Not on file    Gets together: Not on file    Attends religious service: Not on file    Active member of club or organization: Not on file    Attends meetings of clubs or organizations: Not on file    Relationship status: Not on file  . Intimate partner violence:    Fear of current or ex partner: Not on file    Emotionally abused: Not on file    Physically abused: Not on file    Forced sexual activity: Not on file  Other Topics Concern  . Not on file  Social History Narrative  . Not on file    Family History: No family history on file.  Medications:   Current Outpatient Medications on File Prior to Visit  Medication Sig Dispense Refill  . aspirin 325 MG tablet Take 1 tablet (325 mg total) by mouth daily. 30 tablet 0  . atorvastatin (LIPITOR) 80 MG tablet Take 1 tablet (80 mg total) by mouth daily at 6 PM. 30 tablet 5   No current facility-administered medications on file prior to visit.     Allergies:  No Known Allergies   Physical Exam  There were no vitals filed for this visit. There is no height or weight on file to calculate BMI. No exam data present  General: well developed, well nourished, seated, in no evident distress Head: head normocephalic and atraumatic.   Neck: supple with no carotid or supraclavicular  bruits Cardiovascular: regular rate and rhythm, no murmurs Musculoskeletal: no deformity Skin:  no rash/petichiae Vascular:  Normal pulses all extremities  Neurologic Exam Mental Status: Awake and fully alert. Oriented to place and time. Recent and remote memory intact. Attention span, concentration and fund of knowledge appropriate. Mood and affect appropriate.  Cranial Nerves: Fundoscopic exam reveals sharp disc margins. Pupils equal, briskly reactive to light. Extraocular movements full without nystagmus. Visual fields full to confrontation. Hearing intact. Facial sensation intact. Face, tongue, palate moves normally and symmetrically.  Motor: Normal bulk and tone. Normal strength in all tested extremity muscles. Sensory.: intact to touch , pinprick , position and vibratory sensation.  Coordination: Rapid alternating movements normal in all extremities. Finger-to-nose and heel-to-shin performed accurately bilaterally. Gait and Station: Arises from chair without difficulty. Stance is normal. Gait demonstrates  normal stride length and balance . Able to heel, toe and tandem walk without difficulty.  Reflexes: 1+ and symmetric. Toes downgoing.    NIHSS  *** Modified Rankin  ***    Diagnostic Data (Labs, Imaging, Testing)  CT HEAD WO CONTRAST 11/23/2017 IMPRESSION: 1. Age indeterminate infarct within the medial left temporal lobe. Small infarcts in the left occipital lobe and left thalamus, probably chronic. 2. Atrophy with mild small vessel ischemic changes of the white matter.  MR BRAIN WO CONTRAST MR MRA HEAD  11/24/2017 IMPRESSION: MRI HEAD IMPRESSION: 1. Patchy multifocal left PCA territory infarcts involving the parasagittal left temporal occipital region, left thalamus, and left splenium. No associated hemorrhage or mass effect. 2. Atrophy with moderate chronic microvascular ischemic disease. 3. 3.4 cm mass within the right parotid gland, with additional probable 13 mm left  parotid lesion. Findings are nonspecific. Follow-up with nonemergent ENT consultation and referral on an outpatient basis could be performed for further evaluation as clinically warranted. MRA HEAD IMPRESSION: 1. Acute proximal left P2 occlusion. 2. Major intracranial arterial vasculature otherwise widely patent with no hemodynamically significant or correctable stenosis identified. 3. Mild distal small vessel atheromatous irregularity.  ECHOCARDIOGRAM 11/24/2017 Study Conclusions - Left ventricle: The cavity size was normal. Wall thickness was   increased in a pattern of moderate LVH. Systolic function was   mildly to moderately reduced. The estimated ejection fraction was   in the range of 40% to 45%. Akinesis of the basalinferior   myocardium. Doppler parameters are consistent with abnormal left   ventricular relaxation (grade 1 diastolic dysfunction).  ECHO TEE 11/25/2017 Study Conclusions - Left ventricle: The cavity size was normal. Wall thickness was   increased in a pattern of moderate LVH. Systolic function was   mildly to moderately reduced. The estimated ejection fraction was   in the range of 40% to 45%. Akinesis of the basalinferior   myocardium. Doppler parameters are consistent with abnormal left   ventricular relaxation (grade 1 diastolic dysfunction).     ASSESSMENT: Demerius Podolak is a 62 y.o. year old male here with left PCA infarcts on 07/24/2704 embolic secondary to unknown source. Vascular risk factors include HTN, HLD, tobacco use and prior MI with stent placement.     PLAN: -Continue {anticoagulants:31417}  and ***  for secondary stroke prevention -F/u with PCP regarding your *** management -Lower extremity venous Doppler -continue to monitor BP at home -advised to continue to stay active and maintain a healthy diet -Maintain strict control of hypertension with blood pressure goal below 130/90, diabetes with hemoglobin A1c goal below 6.5% and cholesterol  with LDL cholesterol (bad cholesterol) goal below 70 mg/dL. I also advised the patient to eat a healthy diet with plenty of whole grains, cereals, fruits and vegetables, exercise regularly and maintain ideal body weight.  Follow up in *** or call earlier if needed   Greater than 50% of time during this 25 minute visit was spent on counseling,explanation of diagnosis of ***, reviewing risk factor management of ***, planning of further management, discussion with patient and family and coordination of care    Venancio Poisson, [facility]  [facility] [address] [place], [place] [address]  Phone [phone] Fax [phone] Note: This document was prepared with digital dictation and possible smart phrase technology. Any transcriptional errors that result from this process are unintentional.

## 2017-12-29 ENCOUNTER — Encounter: Payer: Self-pay | Admitting: Adult Health

## 2018-01-05 ENCOUNTER — Ambulatory Visit: Payer: Self-pay | Admitting: Physician Assistant

## 2018-01-11 ENCOUNTER — Ambulatory Visit: Payer: Self-pay | Admitting: Family Medicine

## 2018-03-03 ENCOUNTER — Telehealth: Payer: Self-pay | Admitting: Family Medicine

## 2018-03-03 ENCOUNTER — Other Ambulatory Visit: Payer: Self-pay | Admitting: Family Medicine

## 2018-03-03 DIAGNOSIS — I6930 Unspecified sequelae of cerebral infarction: Secondary | ICD-10-CM

## 2018-03-03 NOTE — Telephone Encounter (Signed)
Patient has only been seen here at the office once and I am not sure of patient's current health status however I will send referral order to Sanjuana Letters at hospice of the [place].  I did attempt to contact Sanjuana Letters however she had already left for the day but I was able to leave a message on her voicemail.  I did ask that she return the call to our nurse case manager as I will be out of the office on 03/04/2018 or patient may speak with 1 of the CMA's here at the office if there are any questions regarding the hospice referral in my absence.

## 2018-03-03 NOTE — Telephone Encounter (Signed)
[name] [phone].  FAX [phone]. Pt ex wife James [name] contacted [name] at [facility] to request care. Please fax ORDER to Hospice.

## 2018-03-03 NOTE — Progress Notes (Signed)
Patient ID: James [name], male   DOB: 10/03/1955, 62 y.o.   MRN: [id number]   See phone encounters.  Message received 03/03/2018 from [facility] at [place] regarding possible enrollment of patient.  Patient was seen in the office on 12/14/2017 and did not return for further follow-up.  Patient was seen status post ED visit on 11/29/2017 secondary to right-sided weakness/cerebrovascular accident but patient left the hospital AMA.  Patient's medical history is significant for history of CVA, possible vascular dementia, parotid swelling, prediabetes, history of heart disease as reported by patient and mild renal insufficiency.  Will have order faxed for patient to be evaluated by hospice for possible enrollment.  Phone number for Sanjuana Letters is [phone] and fax number is [phone].

## 2018-03-04 NOTE — Telephone Encounter (Signed)
Order for hospice evaluation faxed to [facility] - attn: Ronnell [place]

## 2018-04-13 DIAGNOSIS — Q211 Atrial septal defect: Secondary | ICD-10-CM

## 2018-04-13 DIAGNOSIS — Q2112 Patent foramen ovale: Secondary | ICD-10-CM

## 2018-04-13 HISTORY — DX: Patent foramen ovale: Q21.12

## 2018-04-13 HISTORY — DX: Atrial septal defect: Q21.1

## 2018-09-06 ENCOUNTER — Telehealth: Payer: Self-pay

## 2018-09-06 NOTE — Telephone Encounter (Signed)
   [facility] Pre-operative Risk Assessment    Request for surgical clearance:  1. What type of surgery is being performed? Right Parotidectomy   2. When is this surgery scheduled? 10/14/18   3. What type of clearance is required (medical clearance vs. Pharmacy clearance to hold med vs. Both)? Both  4. Are there any medications that need to be held prior to surgery and how long? Eliquis   5. Practice name and name of physician performing surgery? [facility]/ Dr. Leta Baptist  6. What is your office phone number [phone]    7.   What is your office fax number [phone]  8.   Anesthesia type (None, local, MAC, general) ?  None listed   Mady Haagensen 09/06/2018, 3:36 PM  _________________________________________________________________   (provider comments below)

## 2018-09-07 NOTE — Telephone Encounter (Signed)
    Primary Cardiologist: Dr. Rayann Heman  Chart reviewed as part of pre-operative protocol coverage.  The patient was seen once by Dr. Rayann Heman 11/2017 for Cryptogenic stroke. Followed up event monitor showed NSVT. He suppose to follow up with Dr. Rayann Heman but never did. Per record in EPIC, Eliquis is not listed as current medication.  Because of James [name] past medical history and time since last visit, he/she will require a follow-up visit in order to better assess preoperative cardiovascular risk.  Pre-op covering staff: - Please schedule appointment and call patient to inform them. - Please contact requesting surgeon's office via preferred method (i.e, phone, fax) to inform them of need for appointment prior to surgery.  Will need pharmacy review if taking anticoagulation but needs updated hx first.   Leanor Kail, PA  09/07/2018, 8:57 AM

## 2018-09-09 NOTE — Telephone Encounter (Signed)
Pt has appointment to see Dr. Rayann Heman on 09/12/2018

## 2018-09-12 ENCOUNTER — Encounter (INDEPENDENT_AMBULATORY_CARE_PROVIDER_SITE_OTHER): Payer: Self-pay

## 2018-09-12 ENCOUNTER — Ambulatory Visit: Payer: Medicare Other | Admitting: Internal Medicine

## 2018-09-12 ENCOUNTER — Other Ambulatory Visit: Payer: Self-pay

## 2018-09-12 ENCOUNTER — Encounter: Payer: Self-pay | Admitting: Internal Medicine

## 2018-09-12 VITALS — BP 136/88 | HR 92 | Ht 66.0 in | Wt 205.8 lb

## 2018-09-12 DIAGNOSIS — I25118 Atherosclerotic heart disease of native coronary artery with other forms of angina pectoris: Secondary | ICD-10-CM | POA: Diagnosis not present

## 2018-09-12 DIAGNOSIS — I252 Old myocardial infarction: Secondary | ICD-10-CM | POA: Insufficient documentation

## 2018-09-12 DIAGNOSIS — I2510 Atherosclerotic heart disease of native coronary artery without angina pectoris: Secondary | ICD-10-CM

## 2018-09-12 DIAGNOSIS — Z01810 Encounter for preprocedural cardiovascular examination: Secondary | ICD-10-CM | POA: Diagnosis not present

## 2018-09-12 NOTE — Progress Notes (Signed)
PCP: Antony Blackbird, MD  Cardiology:  Previously Dr Edd Arbour with [facility] Primary EP: Dr Rayann Heman  James [name] is a 63 y.o. male who presents today for cardiology DOD followup.  The patient has planned R parotid surgery for mass by Dr Benjamine Mola. He presents for cardiology clearance.  Of note, I have not seen hin since consultation for cryptogenic stroke 11/25/17.  He was not compliant with outpatient follow-up.  He had 30 day monitor placed then which revealed no arrhythmias.  Of note, echo 9/19 revealed EF 40-45% however TEE revealed normal EF. He has made some recovery post stroke.  He continues to have difficulty with his memory.  He also has chronic R arm/hand "tingling" for which he is followed by neurology.  He is not very active.  He saw Dr Edd Arbour (his notes reviewed) and was placed on eliquis "empirically" post stroke.  Zio monitor was placed which did not reveal any arrhythmias.  An Adenosine myoview as advised however the patient did not have this done.  He is not very active.  He has limitations on his R side post stroke.  He has chronic SOB with moderate activity,  Occasional chest discomfort.  Today, he denies symptoms of palpitations,  lower extremity edema, dizziness, presyncope, or syncope.  The patient is otherwise without complaint today.   Past Medical History:  Diagnosis Date  . Heart disease    patient reports 2 stents ini his heart "years ago"   Past Surgical History:  Procedure Laterality Date  . TEE WITHOUT CARDIOVERSION N/A 11/25/2017   Procedure: TRANSESOPHAGEAL ECHOCARDIOGRAM (TEE) Bubble Study;  Surgeon: Jolaine Artist, MD;  Location: [facility] ENDOSCOPY;  Service: Cardiovascular;  Laterality: N/A;    ROS- all systems are reviewed and negatives except as per HPI above  Current Outpatient Medications  Medication Sig Dispense Refill  . aspirin 325 MG tablet Take 1 tablet (325 mg total) by mouth daily. 30 tablet 0  . atorvastatin (LIPITOR) 80 MG tablet Take 1 tablet (80 mg total) by  mouth daily at 6 PM. 30 tablet 5  . ELIQUIS 5 MG TABS tablet Take 1 tablet by mouth 2 (two) times a day.    . gabapentin (NEURONTIN) 600 MG tablet Take 1 tablet by mouth 3 (three) times daily. I tablet in morning and 2 tablets in the evening    . metoprolol succinate (TOPROL-XL) 25 MG 24 hr tablet Take 1 tablet by mouth daily.     No current facility-administered medications for this visit.     Physical Exam: Vitals:   09/12/18 0903  BP: 136/88  Pulse: 92  SpO2: 96%  Weight: 205 lb 12.8 oz (93.4 kg)  Height: 5\' 6"  (1.676 m)    GEN- The patient is well appearing, alert and oriented x 3 today.  He has difficulty with short term memory.  He is frequently aggitated and required frequent redirection and repeating of what Ive said.  He uses profanity in the office today and is not overall nice. Head- normocephalic, atraumatic Eyes-  Sclera clear, conjunctiva pink Ears- hearing intact Oropharynx- clear R parotid mass is noted Lungs- Clear to ausculation bilaterally, normal work of breathing Heart- Regular rate and rhythm, no murmurs, rubs or gallops, PMI not laterally displaced GI- soft, NT, ND, + BS Extremities- no clubbing, cyanosis, or edema  Wt Readings from Last 3 Encounters:  09/12/18 205 lb 12.8 oz (93.4 kg)  12/14/17 204 lb (92.5 kg)  11/24/17 207 lb 7.3 oz (94.1 kg)  EKG tracing ordered today is personally reviewed and shows sinus rhythm, inferior infarction  Assessment and Plan:  1. Prior stroke Followed by neurology "empiric" use of eliquis, without documented afib for stroke is not advised in guidelines and has fallen out of favor with trials such as Navigate ESUS.  I have therefore stopped eliquis today.  He has worn 2 prior monitors (both personally reviewed) without AF detected.  Though he could consider long term monitoring with ILR, I worry about his poor compliance in the past.  He states that he has already self discontinued his eliquis.  2. preop Not very  active.  He has CAD risk factors and inferior infarct on ekg.  I would advise adenosine myoview prior to surgery.  If low risk, no further workup is planned.  3. CAD On ASA and statin Continue metoprolol myoview as above  He wishes to follow with cardiology through our practice.  I will refer to general cardiology for long term management.  I will see as needed going forward.  Thompson Grayer MD, [facility] 09/12/2018 9:22 AM

## 2018-09-12 NOTE — Patient Instructions (Signed)
Medication Instructions:  Your physician has recommended you make the following change in your medication:   1.  STOP taking ELIQUIS  Labwork: None ordered.  Testing/Procedures: Your physician has requested that you have a lexiscan myoview. For further information please visit [email or web address]. Please follow instruction sheet, as given.  Please schedule for lexiscan myoview.  Follow-Up: Your physician wants you to follow-up in: 3 months with general cardiology  Referral has been entered.   Any Other Special Instructions Will Be Listed Below (If Applicable).  If you need a refill on your cardiac medications before your next appointment, please call your pharmacy.

## 2018-09-13 ENCOUNTER — Telehealth (HOSPITAL_COMMUNITY): Payer: Self-pay | Admitting: *Deleted

## 2018-09-13 NOTE — Telephone Encounter (Signed)
Patient given detailed instructions per Myocardial Perfusion Study Information Sheet for the test on 09/15/18. Patient notified to arrive 15 minutes early and that it is imperative to arrive on time for appointment to keep from having the test rescheduled.  If you need to cancel or reschedule your appointment, please call the office within 24 hours of your appointment. . Patient verbalized understanding. Kirstie Peri, RN

## 2018-09-15 ENCOUNTER — Encounter (HOSPITAL_COMMUNITY): Payer: Self-pay

## 2018-09-15 ENCOUNTER — Ambulatory Visit (HOSPITAL_COMMUNITY): Payer: Medicare Other | Attending: Internal Medicine

## 2018-09-15 ENCOUNTER — Other Ambulatory Visit: Payer: Self-pay

## 2018-09-15 DIAGNOSIS — I252 Old myocardial infarction: Secondary | ICD-10-CM | POA: Insufficient documentation

## 2018-09-15 DIAGNOSIS — I2510 Atherosclerotic heart disease of native coronary artery without angina pectoris: Secondary | ICD-10-CM | POA: Insufficient documentation

## 2018-09-15 LAB — MYOCARDIAL PERFUSION IMAGING
LV dias vol: 106 mL (ref 62–150)
LV sys vol: 61 mL
Peak HR: 107 {beats}/min
Rest HR: 75 {beats}/min
SDS: 2
SRS: 6
SSS: 9
TID: 1.16

## 2018-09-15 MED ORDER — REGADENOSON 0.4 MG/5ML IV SOLN
0.4000 mg | Freq: Once | INTRAVENOUS | Status: AC
  Administered 2018-09-15: 0.4 mg via INTRAVENOUS

## 2018-09-15 MED ORDER — TECHNETIUM TC 99M TETROFOSMIN IV KIT
10.6000 | PACK | Freq: Once | INTRAVENOUS | Status: AC | PRN
  Administered 2018-09-15: 10.6 via INTRAVENOUS
  Filled 2018-09-15: qty 11

## 2018-09-15 MED ORDER — TECHNETIUM TC 99M TETROFOSMIN IV KIT
30.7000 | PACK | Freq: Once | INTRAVENOUS | Status: AC | PRN
  Administered 2018-09-15: 30.7 via INTRAVENOUS
  Filled 2018-09-15: qty 31

## 2018-09-20 ENCOUNTER — Other Ambulatory Visit: Payer: Self-pay | Admitting: Otolaryngology

## 2018-09-28 ENCOUNTER — Telehealth: Payer: Self-pay

## 2018-09-28 ENCOUNTER — Telehealth: Payer: Self-pay | Admitting: Internal Medicine

## 2018-09-28 DIAGNOSIS — I252 Old myocardial infarction: Secondary | ICD-10-CM

## 2018-09-28 DIAGNOSIS — I2510 Atherosclerotic heart disease of native coronary artery without angina pectoris: Secondary | ICD-10-CM

## 2018-09-28 NOTE — Telephone Encounter (Signed)
Left detailed message with stress test results.  Advised ok to proceed with surgery.  Also advised referral to general cardiology placed for patient.

## 2018-09-28 NOTE — Telephone Encounter (Signed)
   Primary Cardiologist: recently seen by Dr. Rayann Heman, pending referral to establish with general cardiology instead  Chart reviewed as part of pre-operative protocol coverage. Given past medical history and time since last visit, based on ACC/AHA guidelines, James [name] would be at acceptable risk for the planned procedure without further cardiovascular testing.   I will route this recommendation to the requesting party via Epic fax function and remove from pre-op pool.  Please call with questions. Per Dr. Rayann Heman, patient is cleared to proceed with surgery.  [place], [place] 09/28/2018, 5:04 PM

## 2018-09-28 NOTE — Telephone Encounter (Signed)
New Message   Patient is returning call to obtain myocardial perfusion results. Please call.

## 2018-09-28 NOTE — Telephone Encounter (Signed)
-----   Message from Thompson Grayer, MD sent at 09/18/2018  9:13 PM EDT ----- Results reviewed.  [name], please inform pt of result.  No reversible ischemia.  OK to proceed with surgery.  Please refer to general cardiology for follow-up. I will route to primary care also.

## 2018-09-29 NOTE — Telephone Encounter (Signed)
Called the requesting office and spoke with Nira Conn she informed me that the clearance was not received. I let her know that I will have a coworker to fax it over to the office.

## 2018-09-29 NOTE — Telephone Encounter (Signed)
Called Heather to confirm if surgical clearance was received and she confirmed that the clearance was received.

## 2018-10-03 ENCOUNTER — Telehealth: Payer: Self-pay | Admitting: Cardiology

## 2018-10-03 NOTE — Telephone Encounter (Signed)
   Primary Cardiologist: Thompson Grayer, MD  Chart reviewed as part of pre-operative protocol coverage. Given past medical history and time since last visit, based on ACC/AHA guidelines, James [name] would be at acceptable risk for the planned procedure without further cardiovascular testing.  Recent evaluation by Dr. Rayann Heman 09/12/18,  his eliquis was stopped and he underwent nuclear stress test that was neg for ischemia.  Dr. Rayann Heman has cleared for surgery.  I will route this recommendation to the requesting party via Epic fax function and remove from pre-op pool.  Please call with questions.  Cecilie Kicks, NP 10/03/2018, 8:21 AM

## 2018-10-07 ENCOUNTER — Other Ambulatory Visit: Payer: Self-pay

## 2018-10-07 ENCOUNTER — Encounter (HOSPITAL_BASED_OUTPATIENT_CLINIC_OR_DEPARTMENT_OTHER): Payer: Self-pay | Admitting: *Deleted

## 2018-10-07 NOTE — Progress Notes (Signed)
Chart reviewed with Dr Ermalene Postin, [place] for [facility].

## 2018-10-11 ENCOUNTER — Other Ambulatory Visit (HOSPITAL_COMMUNITY)
Admission: RE | Admit: 2018-10-11 | Discharge: 2018-10-11 | Disposition: A | Discharge status: Home / Self Care | Payer: Medicare Other | Source: Ambulatory Visit | Attending: Otolaryngology | Admitting: Otolaryngology

## 2018-10-11 DIAGNOSIS — Z1159 Encounter for screening for other viral diseases: Secondary | ICD-10-CM | POA: Insufficient documentation

## 2018-10-11 LAB — SARS CORONAVIRUS 2 (TAT 6-24 HRS): SARS Coronavirus 2: NEGATIVE

## 2018-10-14 ENCOUNTER — Encounter (HOSPITAL_BASED_OUTPATIENT_CLINIC_OR_DEPARTMENT_OTHER)
Admission: RE | Disposition: A | Discharge status: Home / Self Care | Payer: Self-pay | Source: Home / Self Care | Attending: Otolaryngology

## 2018-10-14 ENCOUNTER — Other Ambulatory Visit: Payer: Self-pay

## 2018-10-14 ENCOUNTER — Ambulatory Visit (HOSPITAL_BASED_OUTPATIENT_CLINIC_OR_DEPARTMENT_OTHER): Payer: Medicare Other | Admitting: Anesthesiology

## 2018-10-14 ENCOUNTER — Ambulatory Visit (HOSPITAL_BASED_OUTPATIENT_CLINIC_OR_DEPARTMENT_OTHER)
Admission: RE | Admit: 2018-10-14 | Discharge: 2018-10-15 | Disposition: A | Discharge status: Home / Self Care | Payer: Medicare Other | Attending: Otolaryngology | Admitting: Otolaryngology

## 2018-10-14 ENCOUNTER — Encounter (HOSPITAL_BASED_OUTPATIENT_CLINIC_OR_DEPARTMENT_OTHER): Payer: Self-pay

## 2018-10-14 DIAGNOSIS — R221 Localized swelling, mass and lump, neck: Secondary | ICD-10-CM | POA: Diagnosis present

## 2018-10-14 DIAGNOSIS — I252 Old myocardial infarction: Secondary | ICD-10-CM | POA: Insufficient documentation

## 2018-10-14 DIAGNOSIS — F17200 Nicotine dependence, unspecified, uncomplicated: Secondary | ICD-10-CM | POA: Insufficient documentation

## 2018-10-14 DIAGNOSIS — F17210 Nicotine dependence, cigarettes, uncomplicated: Secondary | ICD-10-CM | POA: Diagnosis not present

## 2018-10-14 DIAGNOSIS — D110 Benign neoplasm of parotid gland: Secondary | ICD-10-CM | POA: Diagnosis not present

## 2018-10-14 DIAGNOSIS — D37030 Neoplasm of uncertain behavior of the parotid salivary glands: Secondary | ICD-10-CM

## 2018-10-14 DIAGNOSIS — I10 Essential (primary) hypertension: Secondary | ICD-10-CM | POA: Insufficient documentation

## 2018-10-14 DIAGNOSIS — Z9049 Acquired absence of other specified parts of digestive tract: Secondary | ICD-10-CM

## 2018-10-14 DIAGNOSIS — Z955 Presence of coronary angioplasty implant and graft: Secondary | ICD-10-CM | POA: Insufficient documentation

## 2018-10-14 DIAGNOSIS — E785 Hyperlipidemia, unspecified: Secondary | ICD-10-CM | POA: Insufficient documentation

## 2018-10-14 DIAGNOSIS — I69351 Hemiplegia and hemiparesis following cerebral infarction affecting right dominant side: Secondary | ICD-10-CM | POA: Insufficient documentation

## 2018-10-14 DIAGNOSIS — E669 Obesity, unspecified: Secondary | ICD-10-CM | POA: Insufficient documentation

## 2018-10-14 DIAGNOSIS — I2510 Atherosclerotic heart disease of native coronary artery without angina pectoris: Secondary | ICD-10-CM | POA: Insufficient documentation

## 2018-10-14 DIAGNOSIS — Z6833 Body mass index (BMI) 33.0-33.9, adult: Secondary | ICD-10-CM | POA: Diagnosis not present

## 2018-10-14 HISTORY — PX: PAROTIDECTOMY: SHX2163

## 2018-10-14 HISTORY — DX: Atherosclerotic heart disease of native coronary artery without angina pectoris: I25.10

## 2018-10-14 SURGERY — EXCISION, PAROTID GLAND
Anesthesia: General | Site: Face | Laterality: Right

## 2018-10-14 MED ORDER — METOPROLOL SUCCINATE ER 25 MG PO TB24: 25.0000 mg | ORAL_TABLET | Freq: Every day | ORAL | Status: DC

## 2018-10-14 MED ORDER — LIDOCAINE-EPINEPHRINE 1 %-1:100000 IJ SOLN
INTRAMUSCULAR | Status: DC | PRN
  Administered 2018-10-14: 7 mL

## 2018-10-14 MED ORDER — HYDROCODONE-ACETAMINOPHEN 7.5-325 MG PO TABS: 1.0000 | ORAL_TABLET | Freq: Once | ORAL | Status: DC | PRN

## 2018-10-14 MED ORDER — MEPERIDINE HCL 25 MG/ML IJ SOLN: 6.2500 mg | INTRAMUSCULAR | Status: DC | PRN

## 2018-10-14 MED ORDER — DEXAMETHASONE SODIUM PHOSPHATE 4 MG/ML IJ SOLN
INTRAMUSCULAR | Status: DC | PRN
  Administered 2018-10-14: 10 mg via INTRAVENOUS

## 2018-10-14 MED ORDER — EPHEDRINE 5 MG/ML INJ
INTRAVENOUS | Status: AC
  Filled 2018-10-14: qty 10

## 2018-10-14 MED ORDER — GABAPENTIN 600 MG PO TABS: 600.0000 mg | ORAL_TABLET | Freq: Three times a day (TID) | ORAL | Status: DC

## 2018-10-14 MED ORDER — EPHEDRINE SULFATE-NACL 50-0.9 MG/10ML-% IV SOSY
PREFILLED_SYRINGE | INTRAVENOUS | Status: DC | PRN
  Administered 2018-10-14 (×2): 10 mg via INTRAVENOUS

## 2018-10-14 MED ORDER — MIDAZOLAM HCL 2 MG/2ML IJ SOLN: 1.0000 mg | INTRAMUSCULAR | Status: DC | PRN

## 2018-10-14 MED ORDER — DEXAMETHASONE SODIUM PHOSPHATE 10 MG/ML IJ SOLN
INTRAMUSCULAR | Status: AC
  Filled 2018-10-14: qty 2

## 2018-10-14 MED ORDER — FENTANYL CITRATE (PF) 100 MCG/2ML IJ SOLN
INTRAMUSCULAR | Status: AC
  Filled 2018-10-14: qty 2

## 2018-10-14 MED ORDER — ONDANSETRON HCL 4 MG/2ML IJ SOLN
INTRAMUSCULAR | Status: AC
  Filled 2018-10-14: qty 4

## 2018-10-14 MED ORDER — LIDOCAINE 2% (20 MG/ML) 5 ML SYRINGE
INTRAMUSCULAR | Status: DC | PRN
  Administered 2018-10-14: 100 mg via INTRAVENOUS

## 2018-10-14 MED ORDER — EPHEDRINE 5 MG/ML INJ
INTRAVENOUS | Status: AC
  Filled 2018-10-14: qty 30

## 2018-10-14 MED ORDER — SUCCINYLCHOLINE CHLORIDE 20 MG/ML IJ SOLN
INTRAMUSCULAR | Status: DC | PRN
  Administered 2018-10-14: 100 mg via INTRAVENOUS

## 2018-10-14 MED ORDER — KCL IN DEXTROSE-NACL 20-5-0.45 MEQ/L-%-% IV SOLN
INTRAVENOUS | Status: DC
  Administered 2018-10-14: 14:00:00 via INTRAVENOUS
  Filled 2018-10-14: qty 1000

## 2018-10-14 MED ORDER — ONDANSETRON HCL 4 MG/2ML IJ SOLN: 4.0000 mg | Freq: Once | INTRAMUSCULAR | Status: DC | PRN

## 2018-10-14 MED ORDER — CEFAZOLIN SODIUM 1 G IJ SOLR
INTRAMUSCULAR | Status: AC
  Filled 2018-10-14: qty 40

## 2018-10-14 MED ORDER — FENTANYL CITRATE (PF) 100 MCG/2ML IJ SOLN
50.0000 ug | INTRAMUSCULAR | Status: DC | PRN
  Administered 2018-10-14: 100 ug via INTRAVENOUS
  Administered 2018-10-14: 50 ug via INTRAVENOUS

## 2018-10-14 MED ORDER — OXYCODONE-ACETAMINOPHEN 5-325 MG PO TABS: 1.0000 | ORAL_TABLET | ORAL | Status: DC | PRN

## 2018-10-14 MED ORDER — CEFAZOLIN SODIUM-DEXTROSE 2-3 GM-%(50ML) IV SOLR
INTRAVENOUS | Status: DC | PRN
  Administered 2018-10-14: 2 g via INTRAVENOUS

## 2018-10-14 MED ORDER — PHENYLEPHRINE 40 MCG/ML (10ML) SYRINGE FOR IV PUSH (FOR BLOOD PRESSURE SUPPORT)
PREFILLED_SYRINGE | INTRAVENOUS | Status: DC | PRN
  Administered 2018-10-14: 40 ug via INTRAVENOUS
  Administered 2018-10-14: 80 ug via INTRAVENOUS
  Administered 2018-10-14: 40 ug via INTRAVENOUS
  Administered 2018-10-14: 80 ug via INTRAVENOUS
  Administered 2018-10-14: 120 ug via INTRAVENOUS

## 2018-10-14 MED ORDER — PHENYLEPHRINE 40 MCG/ML (10ML) SYRINGE FOR IV PUSH (FOR BLOOD PRESSURE SUPPORT)
PREFILLED_SYRINGE | INTRAVENOUS | Status: AC
  Filled 2018-10-14: qty 40

## 2018-10-14 MED ORDER — ROCURONIUM BROMIDE 10 MG/ML (PF) SYRINGE
PREFILLED_SYRINGE | INTRAVENOUS | Status: AC
  Filled 2018-10-14: qty 20

## 2018-10-14 MED ORDER — PHENYLEPHRINE 40 MCG/ML (10ML) SYRINGE FOR IV PUSH (FOR BLOOD PRESSURE SUPPORT)
PREFILLED_SYRINGE | INTRAVENOUS | Status: AC
  Filled 2018-10-14: qty 10

## 2018-10-14 MED ORDER — LACTATED RINGERS IV SOLN
INTRAVENOUS | Status: DC
  Administered 2018-10-14 (×2): via INTRAVENOUS

## 2018-10-14 MED ORDER — PROPOFOL 10 MG/ML IV BOLUS
INTRAVENOUS | Status: DC | PRN
  Administered 2018-10-14: 40 mg via INTRAVENOUS
  Administered 2018-10-14: 200 mg via INTRAVENOUS

## 2018-10-14 MED ORDER — PROPOFOL 10 MG/ML IV BOLUS
INTRAVENOUS | Status: AC
  Filled 2018-10-14: qty 20

## 2018-10-14 MED ORDER — MORPHINE SULFATE (PF) 4 MG/ML IV SOLN: 2.0000 mg | INTRAVENOUS | Status: DC | PRN

## 2018-10-14 MED ORDER — OXYCODONE-ACETAMINOPHEN 5-325 MG PO TABS: 1.0000 | ORAL_TABLET | ORAL | 0 refills | Status: AC | PRN

## 2018-10-14 MED ORDER — ONDANSETRON HCL 4 MG/2ML IJ SOLN
INTRAMUSCULAR | Status: DC | PRN
  Administered 2018-10-14: 4 mg via INTRAVENOUS

## 2018-10-14 MED ORDER — FENTANYL CITRATE (PF) 100 MCG/2ML IJ SOLN: 25.0000 ug | INTRAMUSCULAR | Status: DC | PRN

## 2018-10-14 MED ORDER — LIDOCAINE 2% (20 MG/ML) 5 ML SYRINGE
INTRAMUSCULAR | Status: AC
  Filled 2018-10-14: qty 10

## 2018-10-14 MED ORDER — SCOPOLAMINE 1 MG/3DAYS TD PT72: 1.0000 | MEDICATED_PATCH | Freq: Once | TRANSDERMAL | Status: DC

## 2018-10-14 MED ORDER — SUCCINYLCHOLINE CHLORIDE 200 MG/10ML IV SOSY
PREFILLED_SYRINGE | INTRAVENOUS | Status: AC
  Filled 2018-10-14: qty 50

## 2018-10-14 MED ORDER — AMOXICILLIN 875 MG PO TABS: 875.0000 mg | ORAL_TABLET | Freq: Two times a day (BID) | ORAL | 0 refills | Status: AC

## 2018-10-14 SURGICAL SUPPLY — 62 items
APPLICATOR DR MATTHEWS STRL (MISCELLANEOUS) ×2 IMPLANT
ATTRACTOMAT 16X20 MAGNETIC DRP (DRAPES) ×1 IMPLANT
BLADE SURG 15 STRL LF DISP TIS (BLADE) ×1 IMPLANT
BLADE SURG 15 STRL SS (BLADE) ×1
CANISTER SUCT 1200ML W/VALVE (MISCELLANEOUS) ×2 IMPLANT
CORD BIPOLAR FORCEPS 12FT (ELECTRODE) ×2 IMPLANT
COTTONBALL LRG STERILE PKG (GAUZE/BANDAGES/DRESSINGS) ×2 IMPLANT
COVER BACK TABLE REUSABLE LG (DRAPES) ×2 IMPLANT
COVER MAYO STAND REUSABLE (DRAPES) ×2 IMPLANT
COVER WAND RF STERILE (DRAPES) IMPLANT
DECANTER SPIKE VIAL GLASS SM (MISCELLANEOUS) ×1 IMPLANT
DERMABOND ADVANCED (GAUZE/BANDAGES/DRESSINGS) ×1
DERMABOND ADVANCED .7 DNX12 (GAUZE/BANDAGES/DRESSINGS) ×1 IMPLANT
DRAIN CHANNEL 10F 3/8 F FF (DRAIN) IMPLANT
DRAPE SPLIT 6X30 W/TAPE (DRAPES) ×2 IMPLANT
DRAPE SURG 17X23 STRL (DRAPES) IMPLANT
ELECT COATED BLADE 2.86 ST (ELECTRODE) ×2 IMPLANT
ELECT PAIRED SUBDERMAL (MISCELLANEOUS) ×2
ELECT REM PT RETURN 9FT ADLT (ELECTROSURGICAL) ×2
ELECTRODE PAIRED SUBDERMAL (MISCELLANEOUS) ×1 IMPLANT
ELECTRODE REM PT RTRN 9FT ADLT (ELECTROSURGICAL) ×1 IMPLANT
EVACUATOR SILICONE 100CC (DRAIN) IMPLANT
FORCEPS BIPOLAR SPETZLER 8 1.0 (NEUROSURGERY SUPPLIES) ×2 IMPLANT
GAUZE 4X4 16PLY RFD (DISPOSABLE) ×2 IMPLANT
GAUZE SPONGE 4X4 16PLY XRAY LF (GAUZE/BANDAGES/DRESSINGS) ×4 IMPLANT
GLOVE BIO SURGEON STRL SZ 6.5 (GLOVE) IMPLANT
GLOVE BIO SURGEON STRL SZ7 (GLOVE) ×2 IMPLANT
GLOVE BIO SURGEON STRL SZ7.5 (GLOVE) ×2 IMPLANT
GLOVE BIOGEL PI IND STRL 7.0 (GLOVE) IMPLANT
GLOVE BIOGEL PI IND STRL 8 (GLOVE) IMPLANT
GLOVE BIOGEL PI INDICATOR 7.0 (GLOVE) ×2
GLOVE BIOGEL PI INDICATOR 8 (GLOVE) ×1
GOWN STRL REUS W/ TWL LRG LVL3 (GOWN DISPOSABLE) ×2 IMPLANT
GOWN STRL REUS W/TWL LRG LVL3 (GOWN DISPOSABLE) ×2
LOCATOR NERVE 3 VOLT (DISPOSABLE) IMPLANT
NDL HYPO 25X1 1.5 SAFETY (NEEDLE) ×1 IMPLANT
NEEDLE HYPO 25X1 1.5 SAFETY (NEEDLE) ×2 IMPLANT
NS IRRIG 1000ML POUR BTL (IV SOLUTION) ×2 IMPLANT
PACK BASIN DAY SURGERY FS (CUSTOM PROCEDURE TRAY) ×2 IMPLANT
PAD ALCOHOL SWAB (MISCELLANEOUS) ×4 IMPLANT
PENCIL BUTTON HOLSTER BLD 10FT (ELECTRODE) ×2 IMPLANT
PIN SAFETY STERILE (MISCELLANEOUS) IMPLANT
PROBE NERVBE PRASS .33 (MISCELLANEOUS) ×2 IMPLANT
SHEARS HARMONIC 9CM CVD (BLADE) ×2 IMPLANT
SLEEVE SCD COMPRESS KNEE MED (MISCELLANEOUS) ×2 IMPLANT
SPONGE INTESTINAL PEANUT (DISPOSABLE) ×3 IMPLANT
STAPLER VISISTAT 35W (STAPLE) IMPLANT
SUT ETHILON 3 0 PS 1 (SUTURE) IMPLANT
SUT PROLENE 5 0 P 3 (SUTURE) ×2 IMPLANT
SUT SILK 2 0 PERMA HAND 18 BK (SUTURE) ×6 IMPLANT
SUT SILK 2 0 TIES 17X18 (SUTURE)
SUT SILK 2-0 18XBRD TIE BLK (SUTURE) IMPLANT
SUT SILK 3 0 TIES 17X18 (SUTURE) ×1
SUT SILK 3-0 18XBRD TIE BLK (SUTURE) ×1 IMPLANT
SUT SILK 4 0 TIES 17X18 (SUTURE) IMPLANT
SUT VIC AB 3-0 FS2 27 (SUTURE) IMPLANT
SUT VICRYL 4-0 PS2 18IN ABS (SUTURE) ×2 IMPLANT
SYR BULB 3OZ (MISCELLANEOUS) ×2 IMPLANT
SYR CONTROL 10ML LL (SYRINGE) ×2 IMPLANT
TOWEL GREEN STERILE FF (TOWEL DISPOSABLE) ×2 IMPLANT
TRAY DSU PREP LF (CUSTOM PROCEDURE TRAY) ×2 IMPLANT
TUBE CONNECTING 20X1/4 (TUBING) ×2 IMPLANT

## 2018-10-14 NOTE — Anesthesia Procedure Notes (Signed)
Procedure Name: Intubation Date/Time: 10/14/2018 9:39 AM Performed by: Lieutenant Diego, CRNA Pre-anesthesia Checklist: Patient identified, Emergency Drugs available, Suction available and Patient being monitored Patient Re-evaluated:Patient Re-evaluated prior to induction Oxygen Delivery Method: Circle system utilized Preoxygenation: Pre-oxygenation with 100% oxygen Induction Type: IV induction Ventilation: Mask ventilation without difficulty Laryngoscope Size: Miller and 2 Grade View: Grade I Tube type: Oral Tube size: 7.0 mm Number of attempts: 1 Airway Equipment and Method: Stylet Placement Confirmation: ETT inserted through vocal cords under direct vision,  positive ETCO2 and breath sounds checked- equal and bilateral Secured at: 23 cm Tube secured with: Tape Dental Injury: Teeth and Oropharynx as per pre-operative assessment

## 2018-10-14 NOTE — Anesthesia Preprocedure Evaluation (Signed)
Anesthesia Evaluation  Patient identified by MRN, date of birth, ID band Patient awake    Reviewed: Allergy & Precautions, NPO status , Patient's Chart, lab work & pertinent test results  Airway Mallampati: II  TM Distance: >3 FB Neck ROM: Full    Dental no notable dental hx. (+) Teeth Intact   Pulmonary Current Smoker,    Pulmonary exam normal breath sounds clear to auscultation       Cardiovascular hypertension, Pt. on medications and Pt. on home beta blockers + CAD and + Cardiac Stents  Normal cardiovascular exam Rhythm:Regular Rate:Normal  Stents x 2 years ago Hx/o MI  Myocardial perfusion scan 09/15/2018  Nuclear stress EF: 42%.  There was no ST segment deviation noted during stress.  No T wave inversion was noted during stress.  Defect 1: There is a medium defect of moderate severity present in the basal inferolateral, basal anterolateral, mid inferolateral and mid anterolateral location.  Findings consistent with prior myocardial infarction.  This is an intermediate risk study.  Echo 11/25/2017 Left ventricle: Systolic function was normal. The estimated  ejection fraction was in the range of 50% to 55%. Possible mild hypokinesis of the basalinferior myocardium. - Aortic valve: No evidence of vegetation. - Mitral valve: No evidence of vegetation. - Atrial septum: There was a small patent foramen ovale. - Tricuspid valve: No evidence of vegetation. - Pulmonic valve: No evidence of vegetation.   EKG 09/12/2018 NSR, inferior infarct   Neuro/Psych Poor memory Right sided weakness CVA, Residual Symptoms negative psych ROS   GI/Hepatic negative GI ROS, Neg liver ROS,   Endo/Other  Hyperlipidemia Obesity  Renal/GU Renal InsufficiencyRenal disease  negative genitourinary   Musculoskeletal negative musculoskeletal ROS (+)   Abdominal (+) + obese,   Peds  Hematology negative hematology ROS (+)    Anesthesia Other Findings   Reproductive/Obstetrics                             Anesthesia Physical Anesthesia Plan  ASA: III  Anesthesia Plan: General   Post-op Pain Management:    Induction:   PONV Risk Score and Plan: 2 and Midazolam, Ondansetron, Treatment may vary due to age or medical condition and Dexamethasone  Airway Management Planned: Oral ETT  Additional Equipment:   Intra-op Plan:   Post-operative Plan: Extubation in OR  Informed Consent: I have reviewed the patients History and Physical, chart, labs and discussed the procedure including the risks, benefits and alternatives for the proposed anesthesia with the patient or authorized representative who has indicated his/her understanding and acceptance.     Dental advisory given  Plan Discussed with: CRNA and Surgeon  Anesthesia Plan Comments:         Anesthesia Quick Evaluation

## 2018-10-14 NOTE — Transfer of Care (Signed)
Immediate Anesthesia Transfer of Care Note  Patient: James [name]  Procedure(s) Performed: RIGHT PAROTIDECTOMY (Right Face)  Patient Location: PACU  Anesthesia Type:General  Level of Consciousness: awake  Airway & Oxygen Therapy: Patient Spontanous Breathing and Patient connected to face mask oxygen  Post-op Assessment: Report given to RN and Post -op Vital signs reviewed and stable  Post vital signs: Reviewed and stable  Last Vitals:  Vitals Value Taken Time  BP 125/80 10/14/18 1241  Temp    Pulse 101 10/14/18 1243  Resp 17 10/14/18 1243  SpO2 98 % 10/14/18 1243  Vitals shown include unvalidated device data.  Last Pain:  Vitals:   10/14/18 0846  TempSrc: Oral  PainSc: 0-No pain         Complications: No apparent anesthesia complications

## 2018-10-14 NOTE — Progress Notes (Signed)
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  Per hospital policy blood was drawn for exposure panel. 

## 2018-10-14 NOTE — Op Note (Signed)
DATE OF PROCEDURE:  10/14/2018                              OPERATIVE REPORT  SURGEON:  Leta Baptist, MD  PREOPERATIVE DIAGNOSES: 1. Right parotid mass.  POSTOPERATIVE DIAGNOSES: 1. Right parotid mass.  PROCEDURE PERFORMED:  Right total parotidectomy with dissection and preservation of the facial nerve.  ANESTHESIA:  General endotracheal tube anesthesia.  COMPLICATIONS:  None.  ESTIMATED BLOOD LOSS: 100 mL  INDICATION FOR PROCEDURE:  James [name] is a 63 y.o. male with a history of bilateral parotid masses.  The patient had a stroke in September 2018.  At that time, his MRI scan showed incidental findings of bilateral parotid masses.  3.4 cm mass was noted on the right and a smaller 1.3 cm mass was noted on the left.  The patient has been complaining of tightness and pressure along the right side of his neck and face.  The size of the right parotid mass has gradually enlarged. Based on the above findings, the decision was made for the patient to undergo the above-stated procedure. Likelihood of success in reducing symptoms was also discussed.  The risks, benefits, alternatives, and details of the procedure were discussed with the patient.  Questions were invited and answered.  Informed consent was obtained.  DESCRIPTION:  The patient was taken to the operating room and placed supine on the operating table.  General endotracheal tube anesthesia was administered by the anesthesiologist.  The patient was positioned and prepped and draped in a standard fashion for right parotid surgery.  Facial nerve NIMS monitoring electrodes were placed.  The facial nerve monitoring system was functional throughout the case.  1% lidocaine with 1-100,000 epinephrine was infiltrated at the planned site of incision.  A standard right preauricular facelift incision was made.  The incision was carried down to the right lateral neck in a lazy S fashion. A SMAS flap was elevated in the standard fashion.  A large 5 cm  right posterior parotid mass was noted.  Dissection was carried out in the right preauricular crease.  The dissection was carried down to the level of the facial nerve.  The main trunk of the facial nerve was identified and preserved.  Careful dissection was carried out to free the main trunk of the facial nerve and the superior and inferior branches.  At this time, the right parotid mass was noted to extend deep to the inferior branches of the facial nerve.  All the facial nerve branches were identified and preserved.  Facial nerve branches was dissected free from the tumor.  The entire tumor was then excised from the surrounding parotid tissue.  The mass was sent to the pathology department for permanent histologic identification.  The surgical site was copiously irrigated.  A #10 JP drain was placed.  The incision was closed in layers with a 4-0 Vicryl, Dermabond, and 5-0 Prolene sutures.  The care of the patient was turned over to the anesthesiologist.  The patient was awakened from anesthesia without difficulty.  The patient was extubated and transferred to the recovery room in good condition.  OPERATIVE FINDINGS: A 5 cm right parotid mass.  SPECIMEN: Right parotid mass.  FOLLOWUP CARE:  The patient will be admitted for overnight observation.  The patient will follow up in my office in 1 week.  James [name] 10/14/2018 12:23 PM

## 2018-10-14 NOTE — Anesthesia Postprocedure Evaluation (Signed)
Anesthesia Post Note  Patient: James [name]  Procedure(s) Performed: RIGHT PAROTIDECTOMY (Right Face)     Patient location during evaluation: PACU Anesthesia Type: General Level of consciousness: awake and alert Pain management: pain level controlled Vital Signs Assessment: post-procedure vital signs reviewed and stable Respiratory status: spontaneous breathing, nonlabored ventilation, respiratory function stable and patient connected to nasal cannula oxygen Cardiovascular status: blood pressure returned to baseline and stable Postop Assessment: no apparent nausea or vomiting Anesthetic complications: no    Last Vitals:  Vitals:   10/14/18 1315 10/14/18 1400  BP: 131/82 122/74  Pulse: 99 96  Resp: 18   Temp:  37.4 C  SpO2: 95% 95%    Last Pain:  Vitals:   10/14/18 1315  TempSrc:   PainSc: 0-No pain                 Jontavia Leatherbury S

## 2018-10-14 NOTE — H&P (Signed)
Cc: Bilateral parotid masses  HPI: The patient is a 63 y/o male who presents today for evaluation of a parotid mass. The patient had a stroke in September 2019. At that time, MRI showed incidental findings of bilateral parotid masses. A 3.4 cm mass was noted on the right and a 1.3 cm mass on the left. The patient complains of tightness along the right side of his neck and face with occasional discomfort. No dysphagia or odynophagia is noted. The patient was seen by an ENT last year but deferred treatment at that time. The patient has not noted any facial weakness. He is a 50+ pack year smoker but has cut back to 3 packs a week. No previous ENT surgery is noted.  The patient's review of systems (constitutional, eyes, ENT, cardiovascular, respiratory, GI, musculoskeletal, skin, neurologic, psychiatric, endocrine, hematologic, allergic) is noted in the ROS questionnaire.  It is reviewed with the patient.   Family health history: No HTN, DM, CAD, hearing loss or bleeding disorder.  Major events: Stroke 11/2017.  Ongoing medical problems: Stroke.  Social history: The patient is single. She smokes cigarettes daily. He denies the use of alcohol or illegal drugs.   Exam: General: Communicates without difficulty, well nourished, no acute distress. Head: Normocephalic, no evidence injury, no tenderness, facial buttresses intact without stepoff. Eyes: PERRL, EOMI. No scleral icterus, conjunctivae clear. Neuro: CN II exam reveals vision grossly intact.  No nystagmus at any point of gaze. Ears: Auricles well formed without lesions.  Ear canals are intact without mass or lesion.  No erythema or edema is appreciated.  The TMs are intact without fluid. Nose: External evaluation reveals normal support and skin without lesions.  Dorsum is intact.  Anterior rhinoscopy reveals healthy pink mucosa over anterior aspect of inferior turbinates and intact septum.  No purulence noted. Oral:  Oral cavity and oropharynx are intact,  symmetric, without erythema or edema.  Mucosa is moist without lesions. Neck: Full range of motion without pain.  There is no significant lymphadenopathy.  No masses palpable.  Thyroid bed within normal limits to palpation.  Large right parotid mass. Slight fullness is noted on the left. Submandibular glands equal bilaterally without mass.  Trachea is midline. Neuro:  CN 2-12 grossly intact. Gait normal. Vestibular: No nystagmus at any point of gaze. The cerebellar examination is unremarkable.   Assessment 1. Bilateral parotid masses. The right measured 3.4 cm and the left 1.3cm on MRI done in 2019.   Plan  1. The physical exam and MRI findings are reviewed with the patient at length. 2. Treatment options include conservative observation, FNA, and right parotidectomy. The risks, benefits, alternatives, and details of the procedures are extensively reviewed with the patient. Questions are invited and answered. 3. The patient would like to proceed with excision of the right parotid mass. The procedure will be scheduled in accordance with the patient's schedule.

## 2018-10-15 NOTE — Discharge Instructions (Signed)
Parotidectomy, Care After This sheet gives you information about how to care for yourself after your procedure. Your health care provider may also give you more specific instructions. If you have problems or questions, contact your health care provider. What can I expect after the procedure? After the procedure, it is common to have:  Pain and mild swelling at the incision site.  Numbness along the incision.  Numbness in part or all of your ear.  Mild jaw discomfort on the surgical side when you are eating or chewing. This may last up to 2-4 weeks. Follow these instructions at home: Medicines   Take over-the-counter and prescription medicines only as told by your health care provider.  If you were prescribed an antibiotic medicine, take it as told by your health care provider. Do not stop taking the antibiotic even if you start to feel better.  Ask your health care provider if the medicine prescribed to you: ? Requires you to avoid driving or using heavy machinery. ? Can cause constipation. You may need to take actions to prevent or treat constipation, such as:  Drink enough fluid to keep your urine pale yellow.  Take over-the-counter or prescription medicines.  Eat foods that are high in fiber, such as beans, whole grains, and fresh fruits and vegetables.  Limit foods that are high in fat and processed sugars, such as fried or sweet foods. Incision care   Follow instructions from your health care provider about how to take care of your incision. Make sure you: ? Wash your hands with soap and water before and after you change your bandage (dressing). If soap and water are not available, use hand sanitizer. ? Leave stitches (sutures), skin glue, or adhesive strips in place. These skin closures may need to be in place for 2 weeks or longer. If adhesive strip edges start to loosen and curl up, you may trim the loose edges. Do not remove adhesive strips completely unless your health  care provider tells you to do that.  Check your incision area for signs of infection. Check for: ? More redness, swelling, or pain. ? Fluid or blood. ? Warmth. ? Pus or a bad smell.  Follow your health care provider's instructions about cleaning and maintaining the drain that was placed near your incision. Eating and drinking  Follow instructions from your health care provider about eating or drinking restrictions.  If your mouth or jaw is sore, try eating soft foods until you feel better. Activity  Return to your normal activities as told by your health care provider. Ask your health care provider what activities are safe for you.  Rest as told by your health care provider.  Avoid sitting for a long time without moving. Get up to take short walks every 1-2 hours. This is important to improve blood flow and breathing. Ask for help if you feel weak or unsteady.  Do not lift anything that is heavier than 10 lb (4.5 kg), or the limit that you are told, until your health care provider says that it is safe. General instructions  Keep your head raised (elevated) when you lie down during the first few weeks after surgery. This will help prevent increased swelling.  Keep all follow-up visits as told by your health care provider. This is important. Contact a health care provider if:  You have pain that does not get better with medicine.  You have more redness, swelling, or pain around your incision.  You have fluid or blood coming  from your incision.  Your incision feels warm to the touch.  You have pus or a bad smell coming from your incision.  You vomit or feel nauseous.  You have a fever. Get help right away if:  You have more pain, swelling, or redness that suddenly gets worse at the incision site.  You have increasing numbness or weakness in your face.  You have severe pain. Summary  After the procedure, it is common to have mild jaw discomfort on the surgical side  when you are eating or chewing. This may last up to 2-4 weeks.  Follow instructions from your health care provider about how to take care of your incision.  If your mouth or jaw is sore, try eating soft foods until you feel better.  Return to your normal activities as told by your health care provider. Ask your health care provider what activities are safe for you. This information is not intended to replace advice given to you by your health care provider. Make sure you discuss any questions you have with your health care provider. Document Released: 04/11/2010 Document Revised: 12/27/2017 Document Reviewed: 12/29/2017 Elsevier Patient Education  2020 [employer].

## 2018-10-17 ENCOUNTER — Encounter (HOSPITAL_BASED_OUTPATIENT_CLINIC_OR_DEPARTMENT_OTHER): Payer: Self-pay | Admitting: Otolaryngology

## 2018-10-25 NOTE — Discharge Summary (Signed)
Physician Discharge Summary  Patient ID: James [name] MRN: [id number] DOB/AGE: [birth year] 63 y.o.  Admit date: 10/14/2018 Discharge date: 10/25/2018  Admission Diagnoses: Right parotid mass  Discharge Diagnoses: Right parotid mass Active Problems:   History of parotidectomy   Discharged Condition: good  Hospital Course: Pt had an uneventful overnight stay. Pt tolerated po well. No significant bleeding. No stridor.  Consults: None  Significant Diagnostic Studies: None  Treatments: surgery: Right parotidectomy surgery  Discharge Exam: Blood pressure 137/87, pulse 72, temperature 97.9 F (36.6 C), resp. rate 18, height 5\' 6"  (1.676 m), weight 93.1 kg, SpO2 95 %. Incision/Wound:c/d/i Facial nerve function normal  Disposition:   Discharge Instructions    Activity as tolerated - No restrictions   Complete by: As directed    Diet general   Complete by: As directed      Allergies as of 10/15/2018   No Known Allergies     Medication List    STOP taking these medications   aspirin 325 MG tablet   atorvastatin 80 MG tablet Commonly known as: LIPITOR     TAKE these medications   gabapentin 600 MG tablet Commonly known as: NEURONTIN Take 1 tablet by mouth 3 (three) times daily. I tablet in morning and 2 tablets in the evening   metoprolol succinate 25 MG 24 hr tablet Commonly known as: TOPROL-XL Take 1 tablet by mouth daily.     ASK your doctor about these medications   amoxicillin 875 MG tablet Commonly known as: AMOXIL Take 1 tablet (875 mg total) by mouth 2 (two) times daily for 5 days. Ask about: Should I take this medication?   oxyCODONE-acetaminophen 5-325 MG tablet Commonly known as: Percocet Take 1 tablet by mouth every 4 (four) hours as needed for up to 5 days for severe pain. Ask about: Should I take this medication?      Follow-up Information    Leta Baptist, MD On 10/21/2018.   Specialty: Otolaryngology Why: at 3:10pm Contact information: [place]  [id number] [phone]           Signed: Burley Saver 10/25/2018, 8:20 AM

## 2018-11-09 ENCOUNTER — Telehealth: Payer: Self-pay | Admitting: Internal Medicine

## 2018-11-09 NOTE — Telephone Encounter (Signed)
New Message      Pt c/o BP issue: STAT if pt c/o blurred vision, one-sided weakness or slurred speech  1. What are your last 5 BP readings?  This morning was 127/88 patient didn't record any other readings   2. Are you having any other symptoms (ex. Dizziness, headache, blurred vision, passed out)? Dizziness  3. What is your BP issue? Patient states BP has been low and would like to speak to a nurse about the issue.

## 2018-11-09 NOTE — Telephone Encounter (Signed)
I spoke with pt who gave me permission to speak with his domestic partner--James [name] She reports pt was taken off his BP medication and cholesterol medication prior to surgery.  They forgot to resume until a few days ago.  Pt's BP was 177/10 a few days ago. Yesterday it was 117/98 and 119/101.  Today's BP is 127/88.  James reports pt has been feeling weak, walking slowly and feels like his equilibrium is off.  No chest pain.  No slurred speech, no new weakness on one side, no trouble with speech.  I advised if pt had these symptoms he should go to ED immediately. I asked them to contact primary care regarding BP issues and general weakness.  Pt was to establish with general cardiology but this was scheduled as a follow up with Dr. Rayann Heman. I scheduled pt for new pt appointment with Dr. Acie Fredrickson on October 6,2020 at 8:40 and cancelled follow up with Dr. Rayann Heman.

## 2018-11-14 DIAGNOSIS — I259 Chronic ischemic heart disease, unspecified: Secondary | ICD-10-CM | POA: Diagnosis not present

## 2018-11-14 DIAGNOSIS — G629 Polyneuropathy, unspecified: Secondary | ICD-10-CM | POA: Diagnosis not present

## 2018-11-14 DIAGNOSIS — Z0000 Encounter for general adult medical examination without abnormal findings: Secondary | ICD-10-CM | POA: Diagnosis not present

## 2018-11-14 DIAGNOSIS — R749 Abnormal serum enzyme level, unspecified: Secondary | ICD-10-CM | POA: Diagnosis not present

## 2018-11-14 DIAGNOSIS — I252 Old myocardial infarction: Secondary | ICD-10-CM | POA: Diagnosis not present

## 2018-11-14 DIAGNOSIS — Z8673 Personal history of transient ischemic attack (TIA), and cerebral infarction without residual deficits: Secondary | ICD-10-CM | POA: Diagnosis not present

## 2018-11-14 DIAGNOSIS — R0989 Other specified symptoms and signs involving the circulatory and respiratory systems: Secondary | ICD-10-CM | POA: Diagnosis not present

## 2018-11-14 DIAGNOSIS — R42 Dizziness and giddiness: Secondary | ICD-10-CM | POA: Diagnosis not present

## 2018-11-17 DIAGNOSIS — I70203 Unspecified atherosclerosis of native arteries of extremities, bilateral legs: Secondary | ICD-10-CM | POA: Diagnosis not present

## 2018-11-17 DIAGNOSIS — R0989 Other specified symptoms and signs involving the circulatory and respiratory systems: Secondary | ICD-10-CM | POA: Diagnosis not present

## 2018-11-22 DIAGNOSIS — Z8673 Personal history of transient ischemic attack (TIA), and cerebral infarction without residual deficits: Secondary | ICD-10-CM | POA: Diagnosis not present

## 2018-11-22 DIAGNOSIS — G629 Polyneuropathy, unspecified: Secondary | ICD-10-CM | POA: Diagnosis not present

## 2018-11-22 DIAGNOSIS — R42 Dizziness and giddiness: Secondary | ICD-10-CM | POA: Diagnosis not present

## 2018-11-23 ENCOUNTER — Encounter: Payer: Self-pay | Admitting: Vascular Surgery

## 2018-11-23 ENCOUNTER — Ambulatory Visit: Payer: Medicare Other | Admitting: Vascular Surgery

## 2018-11-23 ENCOUNTER — Other Ambulatory Visit: Payer: Self-pay

## 2018-11-23 ENCOUNTER — Other Ambulatory Visit: Payer: Self-pay | Admitting: *Deleted

## 2018-11-23 VITALS — BP 124/83 | HR 86 | Resp 20 | Ht 66.0 in | Wt 200.9 lb

## 2018-11-23 DIAGNOSIS — I739 Peripheral vascular disease, unspecified: Secondary | ICD-10-CM | POA: Diagnosis not present

## 2018-11-23 NOTE — Progress Notes (Signed)
REASON FOR CONSULT:    Peripheral vascular disease.  The consult is requested by Dr. Deland Pretty  ASSESSMENT & PLAN:   PERIPHERAL VASCULAR DISEASE: This patient has evidence of infrainguinal arterial occlusive disease bilaterally.  However he is asymptomatic.  Although his ABIs are about 50% bilaterally he has no symptoms.  I have discussed with him the importance of getting as much exercise as possible.  His stroke does limit his walking some.  We discussed possibly water aerobics.  We also discussed the importance of tobacco cessation.  In addition I discussed the importance of nutrition.  Although he is asymptomatic given that his ABIs are fairly low I have ordered a follow-up arterial Doppler study in 1 year and I will see him back at that time.  He knows to call sooner if he has problems.  If his symptoms progressed and certainly we would consider arteriography to see if he was a candidate for endovascular approach or what his options might be for revascularization.  He will call if he develops any new symptoms.  Deitra Mayo, MD, FACS Beeper [phone] Office: [phone]   HPI:   James [name] is a pleasant 63 y.o. male, who was referred with peripheral vascular disease.  I reviewed the records from the referring office.  The patient was seen on 11/14/2018.  The patient has a history of a previous myocardial infarction and a history of a previous stroke.  He was noted to have diminished pedal pulses and this prompted a duplex scan which showed evidence of multilevel arterial occlusive disease bilaterally.  This reason he was sent for vascular consultation.  The patient had a stroke in September 2019 associated with paresthesias in the right upper extremity and right lower extremity.  I do not get any history of claudication, rest pain, or nonhealing ulcers.  It may be that he is not having claudication symptoms as his activity is fairly limited.  He did have a carotid duplex scan at  the time of his stroke which showed no significant carotid disease.  His risk factors for peripheral vascular disease include hypercholesterolemia and tobacco use.  He smokes less than a pack per day and is been smoking since he was a child.  He denies any history of diabetes, hypertension, or family history of premature cardiovascular disease.  Past Medical History:  Diagnosis Date  . Coronary artery disease    30 yrs ago had stents in heart  . Heart disease    patient reports 2 stents ini his heart "years ago"  . Stroke [facility]) 2019   has short term memory issues and rt sided weakness    No family history on file.  SOCIAL HISTORY: Social History   Socioeconomic History  . Marital status: Single    Spouse name: Not on file  . Number of children: Not on file  . Years of education: Not on file  . Highest education level: Not on file  Occupational History  . Not on file  Social Needs  . Financial resource strain: Not on file  . Food insecurity    Worry: Not on file    Inability: Not on file  . Transportation needs    Medical: Not on file    Non-medical: Not on file  Tobacco Use  . Smoking status: Current Every Day Smoker    Packs/day: 0.50    Types: Cigarettes  . Smokeless tobacco: Never Used  . Tobacco comment: 3pks of cigs /week  Substance and Sexual  Activity  . Alcohol use: Yes    Frequency: Never    Comment: glass wine at night  . Drug use: Never  . Sexual activity: Not on file  Lifestyle  . Physical activity    Days per week: Not on file    Minutes per session: Not on file  . Stress: Not on file  Relationships  . Social Herbalist on phone: Not on file    Gets together: Not on file    Attends religious service: Not on file    Active member of club or organization: Not on file    Attends meetings of clubs or organizations: Not on file    Relationship status: Not on file  . Intimate partner violence    Fear of current or ex partner: Not on file     Emotionally abused: Not on file    Physically abused: Not on file    Forced sexual activity: Not on file  Other Topics Concern  . Not on file  Social History Narrative  . Not on file    No Known Allergies  Current Outpatient Medications  Medication Sig Dispense Refill  . apixaban (ELIQUIS) 5 MG TABS tablet Take by mouth.    . gabapentin (NEURONTIN) 600 MG tablet Take 1 tablet by mouth 3 (three) times daily. I tablet in morning and 2 tablets in the evening    . metoprolol succinate (TOPROL-XL) 25 MG 24 hr tablet Take 25 mg by mouth daily.     No current facility-administered medications for this visit.     REVIEW OF SYSTEMS:  [X]  denotes positive finding, [ ]  denotes negative finding Cardiac  Comments:  Chest pain or chest pressure:    Shortness of breath upon exertion:    Short of breath when lying flat:    Irregular heart rhythm:        Vascular    Pain in calf, thigh, or hip brought on by ambulation:    Pain in feet at night that wakes you up from your sleep:     Blood clot in your veins:    Leg swelling:         Pulmonary    Oxygen at home:    Productive cough:     Wheezing:         Neurologic    Sudden weakness in arms or legs:     Sudden numbness in arms or legs:     Sudden onset of difficulty speaking or slurred speech:    Temporary loss of vision in one eye:     Problems with dizziness:  x       Gastrointestinal    Blood in stool:     Vomited blood:         Genitourinary    Burning when urinating:     Blood in urine:        Psychiatric    Major depression:         Hematologic    Bleeding problems:    Problems with blood clotting too easily:        Skin    Rashes or ulcers:        Constitutional    Fever or chills:     PHYSICAL EXAM:   Vitals:   11/23/18 1301  BP: 124/83  Pulse: 86  Resp: 20  SpO2: 95%  Weight: 200 lb 14.4 oz (91.1 kg)  Height: 5\' 6"  (1.676 m)    GENERAL:  The patient is a well-nourished male, in no acute distress.  The vital signs are documented above. CARDIAC: There is a regular rate and rhythm.  VASCULAR: I do not detect carotid bruits. He has palpable femoral pulses. I cannot palpate popliteal or pedal pulses. PULMONARY: There is good air exchange bilaterally without wheezing or rales. ABDOMEN: Soft and non-tender with normal pitched bowel sounds.  I do not palpate an abdominal aortic aneurysm. MUSCULOSKELETAL: There are no major deformities or cyanosis. NEUROLOGIC: No focal weakness or paresthesias are detected. SKIN: There are no ulcers or rashes noted. PSYCHIATRIC: The patient has a normal affect.  DATA:    BILATERAL LOWER EXTREMITY DUPLEX: I reviewed the lower extremity arterial duplex that was done on 11/17/2018.  The patient has a history of peripheral vascular disease.  His duplex scan showed multilevel arterial occlusive disease bilaterally.  The superficial femoral arteries were occluded bilaterally.  LABS: I have reviewed the labs that were done at the referring office.  Creatinine was 0.87.  LDL cholesterol is 54.  GFR was 91.  CAROTID DUPLEX: The patient did have a carotid duplex scan in September 2019 which showed no evidence of carotid stenosis on either side.  Both vertebral arteries were patent with antegrade flow.

## 2018-11-30 DIAGNOSIS — R748 Abnormal levels of other serum enzymes: Secondary | ICD-10-CM | POA: Diagnosis not present

## 2018-11-30 DIAGNOSIS — N200 Calculus of kidney: Secondary | ICD-10-CM | POA: Diagnosis not present

## 2018-12-01 DIAGNOSIS — Z8673 Personal history of transient ischemic attack (TIA), and cerebral infarction without residual deficits: Secondary | ICD-10-CM | POA: Diagnosis not present

## 2018-12-01 DIAGNOSIS — I639 Cerebral infarction, unspecified: Secondary | ICD-10-CM | POA: Diagnosis not present

## 2018-12-01 DIAGNOSIS — G890 Central pain syndrome: Secondary | ICD-10-CM | POA: Diagnosis not present

## 2018-12-14 ENCOUNTER — Ambulatory Visit: Payer: Medicare Other | Admitting: Internal Medicine

## 2018-12-21 DIAGNOSIS — Z66 Do not resuscitate: Secondary | ICD-10-CM | POA: Diagnosis not present

## 2018-12-27 ENCOUNTER — Ambulatory Visit: Payer: Medicare Other | Admitting: Cardiovascular Disease

## 2020-04-18 ENCOUNTER — Telehealth: Payer: Self-pay

## 2020-04-18 NOTE — Telephone Encounter (Signed)
Attempted to contact patient to schedule a Palliative Care consult appointment. No answer left a message to return call.  

## 2020-04-22 ENCOUNTER — Telehealth: Payer: Self-pay

## 2020-04-22 NOTE — Telephone Encounter (Signed)
Spoke with patient and scheduled an in-person Palliative Consult for 05/08/20 @10 :30 AM  COVID screening was negative. No pets in home. Patient's son will be at consult.  Consent obtained; updated Outlook/Netsmart/Team List and Epic.  Family is aware they will be receiving a call from NP the day before or day of to confirm appointment.

## 2020-04-22 NOTE — Telephone Encounter (Signed)
Attempted to contact patient to schedule a Palliative Care consult appointment. No answer left a message to return call.  

## 2020-05-08 ENCOUNTER — Other Ambulatory Visit: Payer: Self-pay

## 2020-05-08 ENCOUNTER — Other Ambulatory Visit: Payer: Medicare (Managed Care) | Admitting: Nurse Practitioner

## 2020-05-08 DIAGNOSIS — Z515 Encounter for palliative care: Secondary | ICD-10-CM

## 2020-05-08 DIAGNOSIS — R52 Pain, unspecified: Secondary | ICD-10-CM

## 2020-05-08 NOTE — Progress Notes (Signed)
[facility] Consult Note Telephone: [phone]  Fax: [phone]  PATIENT NAME: James [name] [address] [place] [address] [phone] (home) [phone] (work) DOB: [birth year] MRN: [id number]  PRIMARY CARE PROVIDER:    Merwyn [name],  [address] [place] [address] [phone]  REFERRING PROVIDER:   Merwyn [name] [address] [place],  [place] [address] [phone]  RESPONSIBLE PARTY:   Extended Emergency Contact Information Primary Emergency Contact: James [name] Mobile Phone: [phone] Relation: Significant other  I met face to face with patient in home.   ASSESSMENT AND RECOMMENDATIONS:   Advance Care Planning: Patient was a formerly in [employer], he was discharged from Hospice care last month for no longer meeting the criteria for the program. Today's visit consisted of building trust and discussions on Palliative care medicine as specialized medical care for people living with serious illness, aimed at facilitating improved quality of life through symptoms relief, assisting with advance care planning and establishing goals of care. Family expressed appreciation for education provided on Palliative care and how it differs from Hospice service. Palliative care will continue to provide support to patient, family and the medical team. Goal of care: Goal of care is comfort. Patient does not desire aggressive treatment for any acute illness. Directives: Patient does not desire resuscitation in the event of cardiac or respiratory arrest. He has a signed DNR form in home, copy uploaded to [facility] EMR.  Symptom Management:  Patient with chronic weakness, right side greater than left side, he also has memory issues due to hx of CVA. Patient having difficulty remembering, unable to substantially answer most questions during visit today. He has not  reestablished care with his primary care provider since discharge from Hospice care, needs refill of his medications as he is running out.  He report not being able to go out of his home due to transportation issues, his car is not motorable and his son does not own a car. Patient verbalized readiness to die, saying he is not depressed or suicidal but just do not want to continue living in his current condition. He vehemently denied suicide or homicidal ideation. His current medication regimen is  Gabapentin 618m tablet, take 2 tab in morning, I tab in afternoon and 2 tabs in evening, Amitriptylin 58mdaily, and Mitazepine 3051maily. Patient was last seen [facility] his primary care physician [facility].  Phone call made to his PCP office, Tele visit appointment made for 3pm tomorrow. Patient agreed to the time. Patient denied fever, denied chills, denied nausea or vomiting, denied recent falls. Report no new concerns. Report son as his main caregiver, son at work at the time of visit. Questions and concerns were addressed. Patient was encouraged to call with questions and/or concerns. My business card was provided.  Follow up Palliative Care Visit: Palliative care will continue to follow for complex decision making and symptom management. Return in about 4 weeks or prn.   Family /Caregiver/Community Supports: Patient lives at home with his son. 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  Cognitive / Functional decline: Patient awake and alert, he is coherent but forgetful. He is able to complete his ADLs independently. Walks without assistive device within his home, he has a front wheel walker he uses outside home. Denied recent falls.  I spent 48 minutes providing this consultation,  time includes time spent with patient/family, chart review, provider coordination, and documentation. More than 50% of the time in this consultation was  spent counseling and coordinating communication.   CHIEF COMPLAINT: right side weakness and numbness  History obtained from review of EMR, discussion with patient. Records reviewed and summarized bellow.  HISTORY OF PRESENT ILLNESS:  James [name] is a 64 y.o. 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 Patient not on statin or any anticoagulant.  Palliative Care was asked to follow this patient to help address advance care planning and symptoms management. This is an initial visit.  CODE STATUS: DNR  PPS: 50%  HOSPICE ELIGIBILITY/DIAGNOSIS: Patient was a former Hospice patient, he was discharged for no longer meeting criteria.  PHYSICAL EXAM / ROS:   Vital signs: BP 152/90, P 82, RR 16, 97% on room air, Weight : 180lbs, 68f11", BMI 25.1 General: frail appearing, sitting in chair in his living room in NAD, coherent and cooperative Cardiovascular: no chest pain reported, no edema  Pulmonary: no cough, no increased SOB, room air GI: no report of swallowing issues, appetite fair, denied constipation, continent of bowel GU: denies dysuria, continent of urine MSK:  no joint and ROM abnormalities, ambulatory Skin: no rashes or wounds reported Neurological: Weakness, right side weakness Psych: non -anxious affect  PAST MEDICAL HISTORY:  Past Medical History:  Diagnosis Date  . Coronary artery disease    30 yrs ago had stents in heart  . Depression   . Heart disease    patient reports 2 stents ini his heart "years ago"  . History of cardioembolic cerebrovascular accident (CVA)   . Hyperlipidemia   . Hypertension   . [place]   . Parotid gland enlargement   . Parotid mass   . PFO (patent foramen ovale) 04/13/2018  . Prediabetes   . Stroke ([facility] 2019   has short term memory issues and rt sided weakness  . Swelling of right parotid gland      SOCIAL HX:  Social History   Tobacco Use  . Smoking status: Current Every Day Smoker    Packs/day: 0.50    Types: Cigarettes  . Smokeless tobacco: Never Used  . Tobacco comment: 3pks of cigs /week  Substance Use Topics  . Alcohol use: Yes    Comment: glass wine at night   FAMILY HX: No family history on file.  ALLERGIES: No Known Allergies    PERTINENT MEDICATIONS:    Thank you for the opportunity to participate in the care of Mr. ENicko Daher The palliative care team will continue to follow. Please call our office at [phone] we can be of additional assistance.  QJari Favre DNP, AGPCNP-BC

## 2020-05-22 ENCOUNTER — Telehealth: Payer: Self-pay

## 2020-05-24 ENCOUNTER — Telehealth: Payer: Self-pay

## 2020-05-24 NOTE — Telephone Encounter (Signed)
(  4:01pm) SW completed a mobile meals referral for patient. Patient was referred back in August is currently at the front of the list. SW confirmed/updated contact information on patient with agency. The agency social workers will be contacting patient to complete an assessment for mobile meals service.  *Primary palliative NP updated.

## 2020-06-09 NOTE — Telephone Encounter (Signed)
na

## 2020-06-11 ENCOUNTER — Other Ambulatory Visit: Payer: Self-pay

## 2020-06-11 ENCOUNTER — Other Ambulatory Visit: Payer: Medicare (Managed Care) | Admitting: Nurse Practitioner

## 2020-06-11 DIAGNOSIS — Z515 Encounter for palliative care: Secondary | ICD-10-CM

## 2020-06-11 DIAGNOSIS — G894 Chronic pain syndrome: Secondary | ICD-10-CM

## 2020-06-11 NOTE — Progress Notes (Signed)
[facility] Pall[facility] Consult Note Telephone: [phone]  Fax: [phone]  PATIENT NAME: James [name] [address] [place] [address] [phone] (home) [phone] (work) DOB: 05/31/1955 MRN: [id number]  PRIMARY CARE PROVIDER:    Merwyn Katos,  [address] [place] [address] [phone]  REFERRING PROVIDER:   Merwyn Katos [address] [place],  [place] [address] [phone]  RESPONSIBLE PARTY:  Emergency contact James [name] (son): [phone]    I met face to face with patient in home.  ASSESSMENT AND RECOMMENDATIONS:   Advance Care Planning: Goal of care: Goal of care is comfort. Patient does not desire aggressive treatment for any acute illness. Directives: Patient does not desire resuscitation in the event of cardiac or respiratory arrest. He has a signed DNR form in home, copy on [facility] EMR.  Cognitive / Functional decline/ Symptom Management:  Patient awake, alert, and coherent. He however has short term memory loss. He is able to complete his ADLs independently, walks without assistive device within his home, he has a front wheel walker he uses outside home. Denied recent falls.  [facility] has been set up for patient. Phone call made to the agency, made aware that services will begin in 2 days.  Patient had a televisit with his PCP 4 weeks ago, report having enough refill on his medications, no change in plan of care reported. Right side weakness, numbness and tingling is ongoing, he report some control with current pain regimen. His current medication regimen is Gabapentin 1270m in morning, 606min afternoon and 120031mn evening. Patient voiced no other concerns at this time. Report no change in function, endorsed feeling about the same. Endorse ongoing cigarette use, report smoking one pack of ciagrrett for 1-3 days. Smoking cessation education  provided, will continue to reinforce need.  Follow up Palliative Care Visit: Palliative care will continue to follow for complex decision making and symptom management. Return in about 6 weeks or prn.  Family /Caregiver/Community Supports: Patient lives at home with his son. 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 More than 50% of the time in this consultation was spent counseling and coordinating communication.   CHIEF COMPLAINT: Follow up palliative care visit.   History obtained from review of EMR and discussion with patient. Records reviewed and summarized bellow.  HISTORY OF PRESENT ILLNESS:  James [name] a 64 76o. 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 Patient not on statin or any anticoagulant. Patient was referred for [facility] Wheels program at last visit and also made appointment for him to reestablish care with his PCP. Patient report having a tele visit with his PCP with no change in his plan of care.  Palliative care was asked to follow this patient to help address advance care planning and symptoms management.  This is a follow up  visit.  CODE STATUS: DNR  PPS: 50%  HOSPICE ELIGIBILITY/DIAGNOSIS: TBD/ Patient was a former Hospice patient, he was discharged for no longer meeting criteria.  ROS Constitution: denies fever, denies chills EYES: denies vision changes ENMT: denies dysphagia Cardiovascular: denies chest pain, denies palpitation Pulmonary: denies cough, denies increased SOB Abdomen:  endorses fair appetite, denies constipation, denies incontinence of bowel GU: denies dysuria, denies incontinence of urine MSK:  endorses ROM limitations, no falls  reported Skin: denies rashes or wounds Neurological: endorses weakness, endorses chronic pain, denies insomnia Psych: denies depression Heme/lymph/immuno: denies bruises, abnormal bleeding   Physical Exam: Current and past weights: report stable weight General: frail appearing, cooperative, NAD EYES: anicteric sclera, lids intact, no discharge  ENMT: intact hearing, oral mucous membranes moist CV:  no LE edema Pulmonary: no increased work of breathing, no cough, no audible wheezes, saturation 98% on room air. Abdomen: no ascites GU: deferred MSK:  ambulatory Skin: warm and dry, no rashes or wounds on visible skin Neuro: Generalized weakness, forgetfull Psych: non-anxious affect today Hem/lymph/immuno: no widespread bruising   PAST MEDICAL HISTORY:  Past Medical History:  Diagnosis Date  . Coronary artery disease    30 yrs ago had stents in heart  . Depression   . Heart disease    patient reports 2 stents ini his heart "years ago"  . History of cardioembolic cerebrovascular accident (CVA)   . Hyperlipidemia   . Hypertension   . Myocardial infarction ([place])   . Parotid gland enlargement   . Parotid mass   . PFO (patent foramen ovale) 04/13/2018  . Prediabetes   . Stroke [facility]) 2019   has short term memory issues and rt sided weakness  . Swelling of right parotid gland     SOCIAL HX:  Social History   Tobacco Use  . Smoking status: Current Every Day Smoker    Packs/day: 0.50    Types: Cigarettes  . Smokeless tobacco: Never Used  . Tobacco comment: 3pks of cigs /week  Substance Use Topics  . Alcohol use: Yes    Comment: glass wine at night   FAMILY HX: No family history on file.  ALLERGIES: No Known Allergies   PERTINENT MEDICATIONS:  Outpatient Encounter Medications as of 06/11/2020  Medication Sig  . apixaban (ELIQUIS) 5 MG TABS tablet Take by mouth.  Marland Kitchen aspirin 325 MG EC tablet Take 325 mg by mouth daily.  Marland Kitchen atorvastatin (LIPITOR) 80 MG tablet Take 80 mg by  mouth daily.  Marland Kitchen gabapentin (NEURONTIN) 600 MG tablet Take 1 tablet by mouth 3 (three) times daily. I tablet in morning and 2 tablets in the evening  . metoprolol succinate (TOPROL-XL) 25 MG 24 hr tablet Take 25 mg by mouth daily.   No facility-administered encounter medications on file as of 06/11/2020.    Thank you for the opportunity to participate in the care of [name]. The palliative care team will continue to follow. Please call our office at [phone] if we can be of additional assistance.  Jari Favre, DNP, AGPCNP-BC

## 2020-07-23 ENCOUNTER — Other Ambulatory Visit: Payer: Medicare (Managed Care) | Admitting: Nurse Practitioner

## 2020-07-23 ENCOUNTER — Other Ambulatory Visit: Payer: Self-pay

## 2020-07-23 VITALS — BP 130/80 | HR 99 | Resp 16

## 2020-07-23 DIAGNOSIS — Z515 Encounter for palliative care: Secondary | ICD-10-CM

## 2020-07-23 DIAGNOSIS — K5900 Constipation, unspecified: Secondary | ICD-10-CM

## 2020-07-23 DIAGNOSIS — G894 Chronic pain syndrome: Secondary | ICD-10-CM

## 2020-07-23 DIAGNOSIS — F32A Depression, unspecified: Secondary | ICD-10-CM

## 2020-07-23 NOTE — Progress Notes (Signed)
[facility] Pall[facility] Consult Note Telephone: [phone]  Fax: [phone]    Date of encounter: 07/23/20 PATIENT NAME: James [name] [address] [place] [address]   [phone] (home) [phone] (work) DOB: 1956/03/18 MRN: [id number]   PRIMARY CARE PROVIDER:    Merwyn Katos,  [address] [place] [address] [phone]  REFERRING PROVIDER:   Merwyn Katos [address] [place],  [place] [address] [phone]  RESPONSIBLE PARTY:    Contact Information    Name Relation Home Work Mobile   Carrucini,millie Significant other   [phone]     I met face to face with patient in home. Patient's husband present in home at the time of the visit. Palliative Care was asked to follow this patient by consultation request of  Heywood Bene, * to address advance care planning and complex medical decision making. This is a follow up visit.                                  ASSESSMENT AND PLAN / RECOMMENDATIONS:   Advance Care Planning/Goals of Care:  CODE STATUS: DNR Goal of care: Goal of care is comfort. Patient does not desire aggressive treatment for any acute illness. Verbalized desire for a quick death when his time comes. He verbalized desire to not do prolong his current state of health.  Directives: Patient reiterated desire to not be resuscitated in the event of cardiac or respiratory arrest. Validation provided. Signed DNR form present in home, copy on [facility] EMR. Provided general support and encouragement. Questions and concerns were addressed. Patient was encouraged to call with questions and/or concerns.  I spent 18 minutes providing this consultation. More than 50% of the time in this consultation was spent in counseling and care coordination. ------------------------------------------------------------------------------  Symptom Management/Plan: Constipation:  Patient declined offer for medication for his constipation saying he does not want to take any more medication. Encouraged to try Miralax which he declined. Patient encourage to let this NP know when he wants some intervention for his constipation. Discussed risk associated with constipation to include bowel perforation. Patient verbalized understanding.   Depression: Patient denied being depressed. He however report being frustrated with his current health condition. Stating he is at peace with dying and not happy that he is still alive and suffering.  He denied having any suicide or homicidal ideation. He said he was just being realistic with his condition. He verbalized frustration with the medical community for trying to keep him alive. He was offered referral to a psychiatrists and Counsellor. Patient declined referral saying he is fine and does not needed to talk or see any more providers. Patient currently on Amitriptyline 38m daily and Mirtazapine 349mat bed time. Patient report sleeping well.  He is an everyday smoker, smokes one pack a cigarette in 1-2 days, smoking cessation education provided. Patient encouraged to call if he changed his mind. Chronic pain syndrome: Report a 10/10 pain during visit today. Patient declined adjustment of pain medication saying nothing works for his pain. Report mild relief with current regimen of  Gabapentin 120064mablet in the morning, 600m38mring the day and 1200mg25mthe evening. Palliative care will continue to provide support to patient, family and medical team. Patient encouraged to call with questions. Palliative care will continue to provide support to patient, family and medical team.  Patient's son updated on discussion with patient. He verbalized awareness of patient's frustration about his health. Son encouraged to call with questions or concerns.  Follow up Palliative Care Visit: Palliative care will continue to follow for complex medical decision  making, advance care planning, and clarification of goals. Return in about 6-8 weeks or prn.  PPS: 50%  HOSPICE ELIGIBILITY/DIAGNOSIS: patient was discharged form Hospice services for no longer meeting eligibility criteria  CHIEF COMPLAIN: Constipation  History obtained from review of Epic EMR and discussion with Mr. Brining.   HISTORY OF PRESENT ILLNESS:  James [name] is a 65 y.o. year old male  with complain of constipation, report condition is ongoing and chronic, last bowel movement was 4 days ago. Report not taking any medication for it, report condition is aggravated by poor diet and relieved by adjusting his diet when needed. Patient denied abdominal pain or distention, denied nausea or vomiting. Ten systems reviewed and are negative for acute change, except as noted in the HPI.  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 He is not on statin or any anticoagulant. 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 He verbalized appreciation for the meal service.  I reviewed available labs, medications, imaging, studies and related documents from the EMR.  Records reviewed and summarized above.   Physical Exam: General: frail appearing, NAD  CV: RRR, no LE edema Pulmonary: LCTA, no increased work of breathing, no cough, room air Abdomen: intake fair, normo-active BS + 4 quadrants, soft and non tender, no ascites GU: deferred Neuro: generalized weakness,  right side weakness, short term memory loss Psych: non-anxious affect, A and O x 3 Hem/lymph/immuno: no widespread bruising  Past Medical History:  Diagnosis Date  . Coronary artery disease    30 yrs ago had stents in heart  . Depression   . Heart disease    patient reports 2 stents ini his heart "years ago"  . History of cardioembolic cerebrovascular  accident (CVA)   . Hyperlipidemia   . Hypertension   . Myocardial infarction ([place])   . Parotid gland enlargement   . Parotid mass   . PFO (patent foramen ovale) 04/13/2018  . Prediabetes   . Stroke [facility]) 2019   has short term memory issues and rt sided weakness  . Swelling of right parotid gland    Thank you for the opportunity to participate in the care of Mr. Mier.  The palliative care team will continue to follow. Please call our office at [phone] if we can be of additional assistance.   Jari Favre, DNP, AGPCNP-BC  COVID-19 PATIENT SCREENING TOOL Asked and negative response unless otherwise noted:   Have you had symptoms of covid, tested positive or been in contact with someone with symptoms/positive test in the past 5-10 days?

## 2020-08-05 ENCOUNTER — Emergency Department (HOSPITAL_COMMUNITY): Payer: Medicare (Managed Care)

## 2020-08-05 ENCOUNTER — Inpatient Hospital Stay (HOSPITAL_COMMUNITY)
Admission: EM | Admit: 2020-08-05 | Discharge: 2020-08-07 | DRG: 065 | Disposition: A | Discharge status: Hospice | Payer: Medicare (Managed Care) | Attending: Internal Medicine | Admitting: Internal Medicine

## 2020-08-05 DIAGNOSIS — Z8673 Personal history of transient ischemic attack (TIA), and cerebral infarction without residual deficits: Secondary | ICD-10-CM

## 2020-08-05 DIAGNOSIS — Z66 Do not resuscitate: Secondary | ICD-10-CM

## 2020-08-05 DIAGNOSIS — Z515 Encounter for palliative care: Secondary | ICD-10-CM

## 2020-08-05 DIAGNOSIS — R7303 Prediabetes: Secondary | ICD-10-CM | POA: Diagnosis present

## 2020-08-05 DIAGNOSIS — E785 Hyperlipidemia, unspecified: Secondary | ICD-10-CM | POA: Diagnosis present

## 2020-08-05 DIAGNOSIS — Z7901 Long term (current) use of anticoagulants: Secondary | ICD-10-CM

## 2020-08-05 DIAGNOSIS — Z20822 Contact with and (suspected) exposure to covid-19: Secondary | ICD-10-CM | POA: Diagnosis present

## 2020-08-05 DIAGNOSIS — F17210 Nicotine dependence, cigarettes, uncomplicated: Secondary | ICD-10-CM | POA: Diagnosis present

## 2020-08-05 DIAGNOSIS — D72829 Elevated white blood cell count, unspecified: Secondary | ICD-10-CM

## 2020-08-05 DIAGNOSIS — I10 Essential (primary) hypertension: Secondary | ICD-10-CM | POA: Diagnosis present

## 2020-08-05 DIAGNOSIS — I2510 Atherosclerotic heart disease of native coronary artery without angina pectoris: Secondary | ICD-10-CM | POA: Diagnosis present

## 2020-08-05 DIAGNOSIS — H53461 Homonymous bilateral field defects, right side: Secondary | ICD-10-CM | POA: Diagnosis present

## 2020-08-05 DIAGNOSIS — I219 Acute myocardial infarction, unspecified: Secondary | ICD-10-CM

## 2020-08-05 DIAGNOSIS — G8191 Hemiplegia, unspecified affecting right dominant side: Secondary | ICD-10-CM | POA: Diagnosis present

## 2020-08-05 DIAGNOSIS — R29810 Facial weakness: Secondary | ICD-10-CM | POA: Diagnosis present

## 2020-08-05 DIAGNOSIS — Z79899 Other long term (current) drug therapy: Secondary | ICD-10-CM

## 2020-08-05 DIAGNOSIS — I429 Cardiomyopathy, unspecified: Secondary | ICD-10-CM | POA: Diagnosis present

## 2020-08-05 DIAGNOSIS — R29722 NIHSS score 22: Secondary | ICD-10-CM | POA: Diagnosis present

## 2020-08-05 DIAGNOSIS — Z7982 Long term (current) use of aspirin: Secondary | ICD-10-CM

## 2020-08-05 DIAGNOSIS — I252 Old myocardial infarction: Secondary | ICD-10-CM

## 2020-08-05 DIAGNOSIS — I63512 Cerebral infarction due to unspecified occlusion or stenosis of left middle cerebral artery: Secondary | ICD-10-CM | POA: Diagnosis not present

## 2020-08-05 DIAGNOSIS — F32A Depression, unspecified: Secondary | ICD-10-CM | POA: Diagnosis present

## 2020-08-05 DIAGNOSIS — R32 Unspecified urinary incontinence: Secondary | ICD-10-CM | POA: Diagnosis present

## 2020-08-05 DIAGNOSIS — R4701 Aphasia: Secondary | ICD-10-CM | POA: Diagnosis present

## 2020-08-05 NOTE — ED Provider Notes (Signed)
[place] EMERGENCY DEPARTMENT Provider Note   CSN: [id number] Arrival date & time: 08/05/20  2348     History Chief Complaint  Patient presents with  . Cerebrovascular Accident    James [name] is a 65 y.o. male.  The history is provided by the patient and medical records.   65 y.o. M with hx CAD, depression, HLP, HTN, prior MI, parotid mass, PFO, hx of stroke with right sided weakness, presenting to the ED for stroke like symptoms.  Last known well yesterday (Sunday) at 1530.  Found by son today and was far from baseline.  He is not speaking, will not follow commands, and right side is flaccid.  He did have some residual weakness from prior stroke but not this advanced.  Additional history from family at bedside-- son recently moved to [facility] from [place] to care for patient within the past few months.  Son is POA and healthcare proxy.  States recently he just keeps stating "let me die, let me die".  He states he has not really been eating/drinking and has had some vomiting with this.  At baseline, he does walk but is somewhat shaky/unsteady and holds onto the walls when doing so.  He was doing this (Sunday) as well when son last saw him before he went to work at [employer].  He is usually able to carry on conversation, is forgetful but able to verbalize needs/wants, complete ADL's and answer questions. Patient does have active DNR and son reports he pointed to it with his left hand before he called EMS.  Past Medical History:  Diagnosis Date  . Coronary artery disease    30 yrs ago had stents in heart  . Depression   . Heart disease    patient reports 2 stents ini his heart "years ago"  . History of cardioembolic cerebrovascular accident (CVA)   . Hyperlipidemia   . Hypertension   . Myocardial infarction ([place])   . Parotid gland enlargement   . Parotid mass   . PFO (patent foramen ovale) 04/13/2018  . Prediabetes   . Stroke [facility]) 2019   has short term memory issues and rt  sided weakness  . Swelling of right parotid gland     Patient Active Problem List   Diagnosis Date Noted  . History of parotidectomy 10/14/2018  . Coronary artery disease 09/12/2018  . History of MI (myocardial infarction) 09/12/2018  . Acute encephalopathy 11/24/2017  . Mild renal insufficiency 11/24/2017  . History of CVA (cerebrovascular accident) 11/24/2017  . Parotid swelling 11/24/2017    Past Surgical History:  Procedure Laterality Date  . PAROTIDECTOMY Right 10/14/2018   Procedure: RIGHT PAROTIDECTOMY;  Surgeon: Leta Baptist, MD;  Location: [place];  Service: ENT;  Laterality: Right;  . TEE WITHOUT CARDIOVERSION N/A 11/25/2017   Procedure: TRANSESOPHAGEAL ECHOCARDIOGRAM (TEE) Bubble Study;  Surgeon: Jolaine Artist, MD;  Location: [facility] ENDOSCOPY;  Service: Cardiovascular;  Laterality: N/A;       No family history on file.  Social History   Tobacco Use  . Smoking status: Current Every Day Smoker    Packs/day: 0.50    Types: Cigarettes  . Smokeless tobacco: Never Used  . Tobacco comment: 3pks of cigs /week  Vaping Use  . Vaping Use: Never used  Substance Use Topics  . Alcohol use: Yes    Comment: glass wine at night  . Drug use: Never    Home Medications Prior to Admission medications   Medication Sig  Start Date End Date Taking? Authorizing Provider  apixaban (ELIQUIS) 5 MG TABS tablet Take by mouth. 06/24/18   [provider]  aspirin 325 MG EC tablet Take 325 mg by mouth daily.    [provider]  atorvastatin (LIPITOR) 80 MG tablet Take 80 mg by mouth daily.    [provider]  gabapentin (NEURONTIN) 600 MG tablet Take 1 tablet by mouth 3 (three) times daily. I tablet in morning and 2 tablets in the evening 05/16/18   [provider]  metoprolol succinate (TOPROL-XL) 25 MG 24 hr tablet Take 25 mg by mouth daily.    [provider]    Allergies    Patient has no known allergies.  Review of Systems    Review of Systems  Neurological: Positive for speech difficulty and weakness.  All other systems reviewed and are negative.   Physical Exam Updated Vital Signs BP (!) 127/98 (BP Location: Right Arm)   Pulse (!) 103   Resp 17   SpO2 96%   Physical Exam Vitals and nursing note reviewed.  Constitutional:      Appearance: He is well-developed.  HENT:     Head: Normocephalic and atraumatic.  Eyes:     Conjunctiva/sclera: Conjunctivae normal.     Pupils: Pupils are equal, round, and reactive to light.     Comments: Leftward gaze, eyes are not crossing midline during exam  Cardiovascular:     Rate and Rhythm: Normal rate and regular rhythm.     Heart sounds: Normal heart sounds.  Pulmonary:     Effort: Pulmonary effort is normal.     Breath sounds: Normal breath sounds.  Abdominal:     General: Bowel sounds are normal.     Palpations: Abdomen is soft.  Musculoskeletal:        General: Normal range of motion.     Cervical back: Normal range of motion.  Skin:    General: Skin is warm and dry.  Neurological:     Comments: Awake, no verbal response to questions on exam, no purposeful attempts at speech, following limited commands (will turn head right when asked), right upper and lower extremities nearly flaccid, spontaneously moving left upper and lower extremities as normal     ED Results / Procedures / Treatments   Labs (all labs ordered are listed, but only abnormal results are displayed) Labs Reviewed  CBC - Abnormal; Notable for the following components:      Result Value   WBC 14.6 (*)    All other components within normal limits  DIFFERENTIAL - Abnormal; Notable for the following components:   Neutro Abs 11.8 (*)    Monocytes Absolute 1.1 (*)    All other components within normal limits  COMPREHENSIVE METABOLIC PANEL - Abnormal; Notable for the following components:   Glucose, Bld 115 (*)    ALT 46 (*)    Total Bilirubin 1.6 (*)    All other components within  normal limits  TROPONIN I (HIGH SENSITIVITY) - Abnormal; Notable for the following components:   Troponin I (High Sensitivity) 2,170 (*)    All other components within normal limits  RESP PANEL BY RT-PCR (FLU A&B, COVID) ARPGX2  ETHANOL  PROTIME-INR  APTT  I-STAT CHEM 8, ED    EKG None  Radiology CT HEAD WO CONTRAST  Result Date: 08/06/2020 CLINICAL DATA:  Acute neurologic deficit, aphasia EXAM: CT HEAD WITHOUT CONTRAST TECHNIQUE: Contiguous axial images were obtained from the base of the skull through  the vertex without intravenous contrast. COMPARISON:  MRI 12/09/2017 FINDINGS: Brain: There is loss of gray-white matter differentiation and effacement of the overlying sulci involving the left frontal, temporal, and parietal lobes in keeping with a large left acute to subacute MCA territory infarct. No superimposed acute hemorrhage. No midline shift. Moderate periventricular white matter changes are present likely reflecting the sequela of small vessel ischemia. Mild parenchymal volume loss is commensurate with the patient's age. Ventricular size is normal. The cerebellum is unremarkable. Vascular: No asymmetric hyperdense vasculature at the skull base. There is extensive atherosclerotic calcification noted within the carotid siphons and distal vertebral arteries. Skull: Intact Sinuses/Orbits: Small mucous retention cyst noted within the left frontal and maxillary sinuses. Mild mucosal thickening within the a left sphenoid sinus and several ethmoid air cells bilaterally. No air-fluid levels. The orbits are unremarkable. Other: Mastoid air cells and middle ear cavities are clear. IMPRESSION: Large acute to subacute left MCA territory infarct involving the frontal, temporal, and parietal cortices. No superimposed acute hemorrhage. No midline shift. These results were called by telephone at the time of interpretation on 08/06/2020 at 12:22 am to provider [facility] , who verbally acknowledged these  results. Electronically Signed   By: Fidela Salisbury MD   On: 08/06/2020 00:27   DG Chest Port 1 View  Result Date: 08/06/2020 CLINICAL DATA:  Stroke, altered mental status EXAM: PORTABLE CHEST 1 VIEW COMPARISON:  11/29/2017 FINDINGS: Lungs volumes are small, but are symmetric and are clear. No pneumothorax or pleural effusion. Cardiac size within normal limits. Pulmonary vascularity is normal. Osseous structures are age-appropriate. No acute bone abnormality. IMPRESSION: No active disease. Electronically Signed   By: Fidela Salisbury MD   On: 08/06/2020 00:20    Procedures Procedures   CRITICAL CARE Performed by: Larene Pickett   Total critical care time: 45 minutes  Critical care time was exclusive of separately billable procedures and treating other patients.  Critical care was necessary to treat or prevent imminent or life-threatening deterioration.  Critical care was time spent personally by me on the following activities: development of treatment plan with patient and/or surrogate as well as nursing, discussions with consultants, evaluation of patient's response to treatment, examination of patient, obtaining history from patient or surrogate, ordering and performing treatments and interventions, ordering and review of laboratory studies, ordering and review of radiographic studies, pulse oximetry and re-evaluation of patient's condition.   Medications Ordered in ED Medications  acetaminophen (TYLENOL) tablet 650 mg (has no administration in time range)    Or  acetaminophen (TYLENOL) suppository 650 mg (has no administration in time range)  haloperidol (HALDOL) tablet 0.5 mg (has no administration in time range)    Or  haloperidol (HALDOL) 2 MG/ML solution 0.5 mg (has no administration in time range)    Or  haloperidol lactate (HALDOL) injection 0.5 mg (has no administration in time range)  ondansetron (ZOFRAN-ODT) disintegrating tablet 4 mg (has no administration in time range)     Or  ondansetron (ZOFRAN) injection 4 mg (has no administration in time range)  glycopyrrolate (ROBINUL) tablet 1 mg (has no administration in time range)    Or  glycopyrrolate (ROBINUL) injection 0.2 mg (has no administration in time range)    Or  glycopyrrolate (ROBINUL) injection 0.2 mg (has no administration in time range)  antiseptic oral rinse (BIOTENE) solution 15 mL (has no administration in time range)  polyvinyl alcohol (LIQUIFILM TEARS) 1.4 % ophthalmic solution 1 drop (has no administration in time range)  morphine 2 MG/ML injection 1 mg (has no administration in time range)  LORazepam (ATIVAN) tablet 1 mg (has no administration in time range)    Or  LORazepam (ATIVAN) injection 1 mg (has no administration in time range)  chlorproMAZINE (THORAZINE) 12.5 mg in sodium chloride 0.9 % 25 mL IVPB (has no administration in time range)  bisacodyl (DULCOLAX) suppository 10 mg (has no administration in time range)  LORazepam (ATIVAN) injection 1 mg (1 mg Intravenous Given 08/06/20 0219)  nicotine (NICODERM CQ - dosed in mg/24 hours) patch 14 mg (has no administration in time range)    ED Course  I have reviewed the triage vital signs and the nursing notes.  Pertinent labs & imaging results that were available during my care of the patient were reviewed by me and considered in my medical decision making (see chart for details).    MDM Rules/Calculators/A&P  65 y.o. M here with stroke like symptoms.  LKW yesterday (Sunday) at 1530 before son went to work.  At baseline, he is ambulatory albeit somewhat shaky.  He is able to talk, verbalize needs, answer questions, and perform ADLs.  On arrival to ED, he has leftward gaze and RUE/RLE are flaccid.  He is moving his left extremities without apparent difficulty.  He is following limited commands-- ie turned head to right when asked but eyes are not crossing midline.  Concern for LVO.  Will obtain CT head, labs.  Son who is POA is at bedside,  patient has active DNR.  Will make decisions regarding care once further information available.  CT head unfortunately with large acute to subacute left MCA infarct involving frontal, temporal, and parietal areas.  Son unsure how far patient wants [place] to go in regards to treatment/intervention as he does have active DNR.  Will discuss with neurology.  He will require admission.  Neurology has evaluated and had long discussion with family.  Patient had strong wishes about resuscitation efforts such as g-tube, etc.  They have been given options and have declined further stroke work-up.  They have opted to transition to hospice care at this time.  Discussed with Dr. Myna Hidalgo who will admit for ongoing care.  2:10 AM  Trop has come back at 2170.  EKG somewhat changed from prior but not STEMI criteria.  As he is transitioning to hospice care, I doubt this will ultimately change his management.  Hospitalist was updated, family has opted for comfort care only at this point.  Final Clinical Impression(s) / ED Diagnoses Final diagnoses:  Acute ischemic left MCA stroke [facility])    Rx / DC Orders ED Discharge Orders    None       Larene Pickett, PA-C 08/06/20 0226    Orpah Greek, MD 08/06/20 [phone]

## 2020-08-06 ENCOUNTER — Emergency Department (HOSPITAL_COMMUNITY): Payer: Medicare (Managed Care)

## 2020-08-06 ENCOUNTER — Other Ambulatory Visit: Payer: Self-pay

## 2020-08-06 ENCOUNTER — Encounter (HOSPITAL_COMMUNITY): Payer: Self-pay | Admitting: Family Medicine

## 2020-08-06 DIAGNOSIS — R29722 NIHSS score 22: Secondary | ICD-10-CM | POA: Diagnosis present

## 2020-08-06 DIAGNOSIS — R29810 Facial weakness: Secondary | ICD-10-CM | POA: Diagnosis present

## 2020-08-06 DIAGNOSIS — Z515 Encounter for palliative care: Secondary | ICD-10-CM

## 2020-08-06 DIAGNOSIS — F17210 Nicotine dependence, cigarettes, uncomplicated: Secondary | ICD-10-CM | POA: Diagnosis present

## 2020-08-06 DIAGNOSIS — H53461 Homonymous bilateral field defects, right side: Secondary | ICD-10-CM | POA: Diagnosis present

## 2020-08-06 DIAGNOSIS — I10 Essential (primary) hypertension: Secondary | ICD-10-CM | POA: Diagnosis present

## 2020-08-06 DIAGNOSIS — I429 Cardiomyopathy, unspecified: Secondary | ICD-10-CM | POA: Diagnosis present

## 2020-08-06 DIAGNOSIS — Z7189 Other specified counseling: Secondary | ICD-10-CM

## 2020-08-06 DIAGNOSIS — R4701 Aphasia: Secondary | ICD-10-CM | POA: Diagnosis present

## 2020-08-06 DIAGNOSIS — I63512 Cerebral infarction due to unspecified occlusion or stenosis of left middle cerebral artery: Secondary | ICD-10-CM | POA: Diagnosis present

## 2020-08-06 DIAGNOSIS — Z66 Do not resuscitate: Secondary | ICD-10-CM

## 2020-08-06 DIAGNOSIS — E785 Hyperlipidemia, unspecified: Secondary | ICD-10-CM | POA: Diagnosis present

## 2020-08-06 DIAGNOSIS — Z7901 Long term (current) use of anticoagulants: Secondary | ICD-10-CM | POA: Diagnosis not present

## 2020-08-06 DIAGNOSIS — Z8673 Personal history of transient ischemic attack (TIA), and cerebral infarction without residual deficits: Secondary | ICD-10-CM

## 2020-08-06 DIAGNOSIS — Z79899 Other long term (current) drug therapy: Secondary | ICD-10-CM | POA: Diagnosis not present

## 2020-08-06 DIAGNOSIS — R7303 Prediabetes: Secondary | ICD-10-CM | POA: Diagnosis present

## 2020-08-06 DIAGNOSIS — F32A Depression, unspecified: Secondary | ICD-10-CM | POA: Diagnosis present

## 2020-08-06 DIAGNOSIS — R32 Unspecified urinary incontinence: Secondary | ICD-10-CM | POA: Diagnosis present

## 2020-08-06 DIAGNOSIS — I252 Old myocardial infarction: Secondary | ICD-10-CM | POA: Diagnosis not present

## 2020-08-06 DIAGNOSIS — Z7982 Long term (current) use of aspirin: Secondary | ICD-10-CM | POA: Diagnosis not present

## 2020-08-06 DIAGNOSIS — Z20822 Contact with and (suspected) exposure to covid-19: Secondary | ICD-10-CM | POA: Diagnosis present

## 2020-08-06 DIAGNOSIS — G8191 Hemiplegia, unspecified affecting right dominant side: Secondary | ICD-10-CM | POA: Diagnosis present

## 2020-08-06 DIAGNOSIS — D72829 Elevated white blood cell count, unspecified: Secondary | ICD-10-CM | POA: Diagnosis present

## 2020-08-06 DIAGNOSIS — I2510 Atherosclerotic heart disease of native coronary artery without angina pectoris: Secondary | ICD-10-CM | POA: Diagnosis present

## 2020-08-06 LAB — I-STAT CHEM 8, ED
BUN: 17 mg/dL (ref 8–23)
Calcium, Ion: 1.08 mmol/L — ABNORMAL LOW (ref 1.15–1.40)
Chloride: 102 mmol/L (ref 98–111)
Creatinine, Ser: 0.8 mg/dL (ref 0.61–1.24)
Glucose, Bld: 109 mg/dL — ABNORMAL HIGH (ref 70–99)
HCT: 49 % (ref 39.0–52.0)
Hemoglobin: 16.7 g/dL (ref 13.0–17.0)
Potassium: 4 mmol/L (ref 3.5–5.1)
Sodium: 137 mmol/L (ref 135–145)
TCO2: 25 mmol/L (ref 22–32)

## 2020-08-06 LAB — DIFFERENTIAL
Abs Immature Granulocytes: 0.07 10*3/uL (ref 0.00–0.07)
Basophils Absolute: 0.1 10*3/uL (ref 0.0–0.1)
Basophils Relative: 0 %
Eosinophils Absolute: 0 10*3/uL (ref 0.0–0.5)
Eosinophils Relative: 0 %
Immature Granulocytes: 1 %
Lymphocytes Relative: 11 %
Lymphs Abs: 1.6 10*3/uL (ref 0.7–4.0)
Monocytes Absolute: 1.1 10*3/uL — ABNORMAL HIGH (ref 0.1–1.0)
Monocytes Relative: 7 %
Neutro Abs: 11.8 10*3/uL — ABNORMAL HIGH (ref 1.7–7.7)
Neutrophils Relative %: 81 %

## 2020-08-06 LAB — COMPREHENSIVE METABOLIC PANEL
ALT: 46 U/L — ABNORMAL HIGH (ref 0–44)
AST: 30 U/L (ref 15–41)
Albumin: 3.7 g/dL (ref 3.5–5.0)
Alkaline Phosphatase: 112 U/L (ref 38–126)
Anion gap: 14 (ref 5–15)
BUN: 12 mg/dL (ref 8–23)
CO2: 22 mmol/L (ref 22–32)
Calcium: 9 mg/dL (ref 8.9–10.3)
Chloride: 100 mmol/L (ref 98–111)
Creatinine, Ser: 1.05 mg/dL (ref 0.61–1.24)
GFR, Estimated: >60 mL/min (ref >60)
Glucose, Bld: 115 mg/dL — ABNORMAL HIGH (ref 70–99)
Potassium: 4 mmol/L (ref 3.5–5.1)
Sodium: 136 mmol/L (ref 135–145)
Total Bilirubin: 1.6 mg/dL — ABNORMAL HIGH (ref 0.3–1.2)
Total Protein: 7.6 g/dL (ref 6.5–8.1)

## 2020-08-06 LAB — APTT: aPTT: 29 seconds (ref 24–36)

## 2020-08-06 LAB — CBC
HCT: 48.6 % (ref 39.0–52.0)
Hemoglobin: 16.6 g/dL (ref 13.0–17.0)
MCH: 31 pg (ref 26.0–34.0)
MCHC: 34.2 g/dL (ref 30.0–36.0)
MCV: 90.7 fL (ref 80.0–100.0)
Platelets: 300 10*3/uL (ref 150–400)
RBC: 5.36 MIL/uL (ref 4.22–5.81)
RDW: 12.1 % (ref 11.5–15.5)
WBC: 14.6 10*3/uL — ABNORMAL HIGH (ref 4.0–10.5)
nRBC: 0 % (ref 0.0–0.2)

## 2020-08-06 LAB — PROTIME-INR
INR: 1.1 (ref 0.8–1.2)
Prothrombin Time: 14 seconds (ref 11.4–15.2)

## 2020-08-06 LAB — TROPONIN I (HIGH SENSITIVITY): Troponin I (High Sensitivity): 2170 ng/L (ref <18)

## 2020-08-06 LAB — RESP PANEL BY RT-PCR (FLU A&B, COVID) ARPGX2
Influenza A by PCR: NEGATIVE
Influenza B by PCR: NEGATIVE
SARS Coronavirus 2 by RT PCR: NEGATIVE

## 2020-08-06 LAB — ETHANOL: Alcohol, Ethyl (B): <10 mg/dL (ref <10)

## 2020-08-06 LAB — CBG MONITORING, ED: Glucose-Capillary: 121 mg/dL — ABNORMAL HIGH (ref 70–99)

## 2020-08-06 MED ORDER — BISACODYL 10 MG RE SUPP: 10.0000 mg | Freq: Every day | RECTAL | Status: DC | PRN

## 2020-08-06 MED ORDER — GLYCOPYRROLATE 0.2 MG/ML IJ SOLN: 0.2000 mg | INTRAMUSCULAR | Status: DC | PRN

## 2020-08-06 MED ORDER — LORAZEPAM 2 MG/ML IJ SOLN
1.0000 mg | INTRAMUSCULAR | Status: DC | PRN
  Administered 2020-08-06 – 2020-08-07 (×3): 1 mg via INTRAVENOUS
  Filled 2020-08-06: qty 1

## 2020-08-06 MED ORDER — HALOPERIDOL 0.5 MG PO TABS
0.5000 mg | ORAL_TABLET | ORAL | Status: DC | PRN
  Filled 2020-08-06: qty 1

## 2020-08-06 MED ORDER — ONDANSETRON 4 MG PO TBDP: 4.0000 mg | ORAL_TABLET | Freq: Four times a day (QID) | ORAL | Status: DC | PRN

## 2020-08-06 MED ORDER — GLYCOPYRROLATE 1 MG PO TABS
1.0000 mg | ORAL_TABLET | ORAL | Status: DC | PRN
  Filled 2020-08-06: qty 1

## 2020-08-06 MED ORDER — LORAZEPAM 2 MG/ML PO CONC: 1.0000 mg | ORAL | Status: DC | PRN

## 2020-08-06 MED ORDER — LORAZEPAM 1 MG PO TABS: 1.0000 mg | ORAL_TABLET | ORAL | Status: DC | PRN

## 2020-08-06 MED ORDER — MORPHINE SULFATE (PF) 2 MG/ML IV SOLN
1.0000 mg | INTRAVENOUS | Status: DC | PRN
Start: 2020-08-06 — End: 2020-08-08
  Administered 2020-08-06: 1 mg via INTRAVENOUS
  Filled 2020-08-06: qty 1

## 2020-08-06 MED ORDER — BIOTENE DRY MOUTH MT LIQD: 15.0000 mL | OROMUCOSAL | Status: DC | PRN

## 2020-08-06 MED ORDER — HALOPERIDOL LACTATE 5 MG/ML IJ SOLN
0.5000 mg | INTRAMUSCULAR | Status: DC | PRN
  Administered 2020-08-06 – 2020-08-07 (×2): 0.5 mg via INTRAVENOUS
  Filled 2020-08-06 (×2): qty 1

## 2020-08-06 MED ORDER — LORAZEPAM 2 MG/ML IJ SOLN
1.0000 mg | INTRAMUSCULAR | Status: DC | PRN
  Filled 2020-08-06 (×2): qty 1

## 2020-08-06 MED ORDER — HALOPERIDOL LACTATE 2 MG/ML PO CONC
0.5000 mg | ORAL | Status: DC | PRN
  Filled 2020-08-06: qty 0.3

## 2020-08-06 MED ORDER — ACETAMINOPHEN 325 MG PO TABS
650.0000 mg | ORAL_TABLET | Freq: Four times a day (QID) | ORAL | Status: DC | PRN
Start: 2020-08-06 — End: 2020-08-08

## 2020-08-06 MED ORDER — SODIUM CHLORIDE 0.9 % IV SOLN: 12.5000 mg | Freq: Four times a day (QID) | INTRAVENOUS | Status: DC | PRN

## 2020-08-06 MED ORDER — ONDANSETRON HCL 4 MG/2ML IJ SOLN: 4.0000 mg | Freq: Four times a day (QID) | INTRAMUSCULAR | Status: DC | PRN

## 2020-08-06 MED ORDER — ACETAMINOPHEN 650 MG RE SUPP
650.0000 mg | Freq: Four times a day (QID) | RECTAL | Status: DC | PRN
  Administered 2020-08-06: 650 mg via RECTAL
  Filled 2020-08-06: qty 1

## 2020-08-06 MED ORDER — POLYVINYL ALCOHOL 1.4 % OP SOLN: 1.0000 [drp] | Freq: Four times a day (QID) | OPHTHALMIC | Status: DC | PRN

## 2020-08-06 MED ORDER — NICOTINE 14 MG/24HR TD PT24
14.0000 mg | MEDICATED_PATCH | Freq: Every day | TRANSDERMAL | Status: DC
  Administered 2020-08-07: 14 mg via TRANSDERMAL
  Filled 2020-08-06: qty 1
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
## 2020-08-06 NOTE — Progress Notes (Signed)
Manufacturing engineer [facility]) Hospital Liaison: RN note    Notified by Gregary Signs, [place]  of patient/family request for [facility] services at home after discharge. Chart and patient information under review by [facility] physician. Hospice eligibility pending currently.    Writer spoke with son, Meryl Dare  to initiate education related to hospice philosophy, services and team approach to care. Dorian verbalized understanding of information given. Per discussion, plan is for discharge to home by [facility].   Please send signed and completed DNR form home with patient/family. Patient will need prescriptions for discharge comfort medications.     DME needs have been discussed, patient currently has the following equipment in the home: none.  Patient/family requests the following DME for delivery to the home: hospital bed. [place] equipment manager has been notified and will contact DME provider to arrange delivery to the home. Home address has been verified and is correct in the chart. Dorian  is the family member to contact to arrange time of delivery.     [facility] aware of the above. Please notify [facility] when patient is ready to leave the unit at discharge. (Call [phone] or [phone] after 5pm.) [facility] information and contact numbers given to [place].      A Please do not hesitate to call with questions.    Thank you,   Farrel Gordon, RN, [place] (listed on [facility] under [place])    [phone]

## 2020-08-06 NOTE — Consult Note (Signed)
Consultation Note Date: 08/06/2020   Patient Name: James [name]  DOB: [birth year]  MRN: [id number]  Age / Sex: 65 y.o., male  PCP: James Bene, PA-C Referring Physician: Vianne Bulls, MD  Reason for Consultation: Establishing goals of care "severely disabling stroke, family believes patient would want home hospice"  HPI/Patient Profile: 65 y.o. male  with past medical history of CVA with residual right sided weakness, coronary artery disease, cardiomyopathy, and chronic pain who presented to the emergency department on 08/05/2020 with right-sided weakness and aphasia.  ED Course: Patient found to be tachycardic, aphasic, and with right hemiparesis. Head CT concerning for large left MCA territory infarction. Neurology consulted and [facility] was called to admit.   Clinical Assessment and Goals of Care: I have reviewed medical records including EPIC notes, labs and imaging, and examined the patient. He is non-verbal. He is not able to move his right side. He does attempt to follow commands on the left side (squeezed my hands and moved his toes when prompted) although very weak.    11:27--I attempted to call son/James [name]. No answer - I left a confidential voicemail and requested a callback.   13:45--Notified by ER RN that family has arrived to bedside.   15:30--I met at bedside with son and daughter to discuss diagnosis, prognosis, GOC, EOL wishes, disposition, and options. Patient has been followed by outpatient palliative care with [facility].  I re-introduced Palliative Medicine as specialized medical care for people living with serious illness. It focuses on providing relief from the symptoms and stress of a serious illness.   Patient is originally from [place]. He currently lives with his son. As far as functional status prior to admission, patient was able to ambulate without assistive device and  complete his ADLs independently.   We discussed his current illness and what it means in the larger context of his ongoing co-morbidities.  Natural trajectory at EOL was discussed. James [name] shares that his father has been "ready to go" for some time.   Per outpatient palliative note from 06/11/20, patient expressed to NP that his goal was comfort and he did not desire aggressive treatment for any acute illness.   I confirmed with family that goal is comfort, and reviewed the difference between full scope medical intervention and comfort care, emphasizing that the goal is comfort and dignity rather than prolonging life. Discussed transitioning to comfort care in house, and what that would look like--keeping him clean and dry, no labs, no artificial hydration or feeding, no antibiotics, minimizing of medications, comfort feeds (if appropriate), and medication for symptoms such as pain, dyspnea, and agitation.  Discussed the concept of comfort feeds with family. Provided education that patient has known aspiration risk and did not pass his bedside swallow evaluation. Provided education that patients often do not desire food/drink at end of life. However, if patient shows interest in food/drink, family can proceed with caution to provide smalls bites and sites. Discussed that the benefit of comfort would need to outweigh the risk of aspiration  in this situation. Family verbalizes understanding.   Family verbalizes understanding of comfort care and expresses they would like to get him home with hospice. Provided education on the philosophy and benefits of hospice care. Discussed that it offers a holistic approach to care in the setting of end-stage illness/disease, and is about focusing on comfort and dignity while allowing nature to take it's course. Discussed the hospice team includes RNs, physicians, social workers, and chaplains. They can provide personal care, support for the family, and help keep patient out  of the hospital. They can also provide DME - family requests a hospital bed with rails.   Discussed the difference between home hospice versus residential hospice - emphasized that home hospice is intermittent while residential hospice is 24/7 care.  Discussed option to transition to residential hospice [facility]) if needed.   Questions and concerns were addressed.  The family was encouraged to call with questions or concerns.   Primary decision maker: [name]    SUMMARY OF RECOMMENDATIONS    Full comfort measures initiated  DNR/DNI as previously documented  Goal is home with hospice - [facility] - TOC order placed and hospice liaison notified  Family requests DME - a hospital bed with rails  Added orders for symptom management at EOL as well as discontinued orders that were not focused on comfort  Unrestricted visitation orders were placed   Discussed comfort feeds with family - benefit of comfort needs to outweigh risk of aspiration  Provide frequent assessments and administer PRN medications as clinically necessary to ensure EOL comfort  PMT will continue to follow holistically  Patient needs comfort meds at discharge - recommend morphine concentrate solution and ativan concentrate solution   Symptom Management:   Morphine 1 mg IV every 2 hours prn pain  Lorazepam (ATIVAN) prn for anxiety  Haloperidol (HALDOL) prn for agitation   Glycopyrrolate (ROBINUL) for excessive secretions  Ondansetron (ZOFRAN) prn for nausea  Polyvinyl alcohol (LIQUIFILM TEARS) prn for dry eyes  Antiseptic oral rinse (BIOTENE) prn for dry mouth  Palliative Prophylaxis:   Aspiration, Oral Care and Turn Reposition  Additional Recommendations (Limitations, Scope, Preferences):  Full Comfort Care  Psycho-social/Spiritual:   Created space and opportunity for family to express thoughts and feelings regarding patient's current medical situation.   Emotional support provided    Prognosis:   < 2 weeks  Discharge Planning: Home with Hospice      Primary Diagnoses: Present on Admission: . Acute ischemic left MCA stroke ([place]) . Coronary artery disease   I have reviewed the medical record, interviewed the patient and family, and examined the patient. The following aspects are pertinent.  Past Medical History:  Diagnosis Date  . Coronary artery disease    30 yrs ago had stents in heart  . Depression   . Heart disease    patient reports 2 stents ini his heart "years ago"  . History of cardioembolic cerebrovascular accident (CVA)   . Hyperlipidemia   . Hypertension   . Myocardial infarction ([place])   . Parotid gland enlargement   . Parotid mass   . PFO (patent foramen ovale) 04/13/2018  . Prediabetes   . Stroke [facility]) 2019   has short term memory issues and rt sided weakness  . Swelling of right parotid gland     History reviewed. No pertinent family history. Scheduled Meds: . nicotine  14 mg Transdermal Daily   Continuous Infusions: . chlorproMAZINE (THORAZINE) IV     PRN Meds:.acetaminophen **OR** acetaminophen, antiseptic  oral rinse, bisacodyl, chlorproMAZINE (THORAZINE) IV, glycopyrrolate **OR** glycopyrrolate **OR** glycopyrrolate, haloperidol **OR** haloperidol **OR** haloperidol lactate, LORazepam **OR** [DISCONTINUED] LORazepam **OR** LORazepam, LORazepam, morphine injection, ondansetron **OR** ondansetron (ZOFRAN) IV, polyvinyl alcohol Medications Prior to Admission:  Prior to Admission medications   Medication Sig Start Date End Date Taking? Authorizing Provider  amitriptyline (ELAVIL) 50 MG tablet Take 50 mg by mouth at bedtime.   Yes [provider]  mirtazapine (REMERON) 30 MG tablet Take 30 mg by mouth at bedtime.   Yes [provider]  gabapentin (NEURONTIN) 600 MG tablet Take 600 mg by mouth See admin instructions. Take 2 tablets by mouth every morning, 1 tablet at lunch, and 2 tablets every night at bedtime. 05/16/18    [provider]   No Known Allergies Review of Systems  Unable to perform ROS: Patient nonverbal    Physical Exam Vitals reviewed.  Constitutional:      General: He is not in acute distress.    Appearance: He is ill-appearing.  Cardiovascular:     Rate and Rhythm: Tachycardia present.  Pulmonary:     Effort: Pulmonary effort is normal.  Neurological:     Mental Status: He is alert.     Motor: Weakness present.     Comments: Right sided hemiparesis  Psychiatric:        Speech: He is noncommunicative.     Vital Signs: BP 124/86   Pulse 95   Temp 100.2 F (37.9 C) (Axillary)   Resp 18   SpO2 92%  Pain Scale: PAINAD       SpO2: SpO2: 92 % O2 Device:SpO2: 92 % O2 Flow Rate: .O2 Flow Rate (L/min): 2 L/min  IO: Intake/output summary: No intake or output data in the 24 hours ending 08/06/20 1904  LBM: Last BM Date:  (PTA) Baseline Weight:   Most recent weight:        Palliative Assessment/Data: PPS 10%     Time In: 1530 Time Out: 1643 Time Total: 73 minutes Greater than 50%  of this time was spent counseling and coordinating care related to the above assessment and plan.  Signed by: Lavena Bullion, NP   Please contact Palliative Medicine Team phone at [phone] for questions and concerns.  For individual provider: See Shea Evans

## 2020-08-06 NOTE — ED Notes (Signed)
Dr.Pahwani notified of pt temp of 101.4. MD states pt is comfort care. No new orders at this time.

## 2020-08-06 NOTE — H&P (Signed)
History and Physical    James [name] JJO:[id number] DOB: [birth year] DOA: 08/05/2020  PCP: Heywood Bene, PA-C   Patient coming from: Home   Chief Complaint: Not speaking or moving right side   HPI: James [name] is a 65 y.o. 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  He is accompanied by his son and daughter who provide much of the history.  Patient lives with his son who reports that the patient seemed to be in his usual state the afternoon of 08/04/2020 but then stayed in bed the following day, was noted to have urinary incontinence, and when his son tried to help bathe him, noticed that patient was unable to speak and not moving his right side.  Patient had repeatedly discussed with his son that he would not want any aggressive treatments, had filled out DNR paperwork, and was being followed by palliative care.  Patient's family was unsure of how to proceed initially, knowing that the patient would not want to be in the hospital, but unable to care for him in his current state, they eventually decided to call EMS for transport to the hospital.  ED Course: Upon arrival to the ED, patient is found to be slightly tachycardic, aphasic, and with right hemiparesis.  EKG features inferior ST-T abnormalities.  Chest x-ray negative for acute cardiopulmonary disease.  Chemistry panel unremarkable.  CBC with leukocytosis to 14,600.  Head CT concerning for large left MCA territory infarction.  High-sensitivity troponin was 2170.  COVID-19 and influenza PCR negative.  Neurology was consulted by the ED physician and hospitalists asked to admit.  Review of Systems:  All other systems reviewed and apart from HPI, are negative.  Past Medical History:  Diagnosis Date  . Coronary artery disease     30 yrs ago had stents in heart  . Depression   . Heart disease    patient reports 2 stents ini his heart "years ago"  . History of cardioembolic cerebrovascular accident (CVA)   . Hyperlipidemia   . Hypertension   . Myocardial infarction ([place])   . Parotid gland enlargement   . Parotid mass   . PFO (patent foramen ovale) 04/13/2018  . Prediabetes   . Stroke [facility]) 2019   has short term memory issues and rt sided weakness  . Swelling of right parotid gland     Past Surgical History:  Procedure Laterality Date  . PAROTIDECTOMY Right 10/14/2018   Procedure: RIGHT PAROTIDECTOMY;  Surgeon: Leta Baptist, MD;  Location: [facility];  Service: ENT;  Laterality: Right;  . TEE WITHOUT CARDIOVERSION N/A 11/25/2017   Procedure: TRANSESOPHAGEAL ECHOCARDIOGRAM (TEE) Bubble Study;  Surgeon: Jolaine Artist, MD;  Location[facility] ENDOSCOPY;  Service: Cardiovascular;  Laterality: N/A;    Social History:   reports that he has been smoking cigarettes. He has been smoking about 0.50 packs per day. He has never used smokeless tobacco. He reports current alcohol use. He reports that he does not use drugs.  No Known Allergies  History reviewed. No pertinent family history.   Prior to Admission medications   Medication Sig Start Date End Date Taking? Authorizing Provider  apixaban (ELIQUIS) 5 MG TABS tablet Take by mouth. 06/24/18   [provider]  aspirin 325 MG EC tablet Take 325 mg by mouth daily.    [provider]  atorvastatin (LIPITOR) 80 MG tablet Take 80 mg by mouth daily.    [provider]  gabapentin (NEURONTIN) 600 MG tablet Take 1 tablet by mouth 3 (three) times daily. I tablet in morning and 2 tablets in the evening 05/16/18   [provider]  metoprolol succinate (TOPROL-XL) 25 MG 24 hr tablet Take 25 mg by mouth daily.    [provider]    Physical Exam: Vitals:   08/05/20 2353 08/06/20 0004  BP: (!) 127/98   Pulse: (!) 103    Resp: 17   TempSrc: Oral   SpO2: 97% 96%    Constitutional: NAD, calm  Eyes: PERTLA, lids and conjunctivae normal ENMT: Mucous membranes are moist. Posterior pharynx clear of any exudate or lesions.   Neck: normal, supple, no masses, no thyromegaly Respiratory:  no wheezing, no crackles. No accessory muscle use.  Cardiovascular: S1 & S2 heard, regular rate and rhythm. No extremity edema.  Abdomen: No distension, no tenderness, soft. Bowel sounds active.  Musculoskeletal: no clubbing / cyanosis. No joint deformity upper and lower extremities.   Skin: no significant rashes, lesions, ulcers. Warm, dry, well-perfused. Neurologic: Awake. No RUE or RLE movement. Aphasia.  Psychiatric: Calm, not speaking or following commands.    Labs and Imaging on Admission: I have personally reviewed following labs and imaging studies  CBC: Recent Labs  Lab 08/05/20 2348  WBC 14.6*  NEUTROABS 11.8*  HGB 16.6  HCT 48.6  MCV 90.7  PLT 462   Basic Metabolic Panel: Recent Labs  Lab 08/05/20 2348  NA 136  K 4.0  CL 100  CO2 22  GLUCOSE 115*  BUN 12  CREATININE 1.05  CALCIUM 9.0   GFR: CrCl cannot be calculated (Unknown ideal weight.). Liver Function Tests: Recent Labs  Lab 08/05/20 2348  AST 30  ALT 46*  ALKPHOS 112  BILITOT 1.6*  PROT 7.6  ALBUMIN 3.7   No results for input(s): LIPASE, AMYLASE in the last 168 hours. No results for input(s): AMMONIA in the last 168 hours. Coagulation Profile: Recent Labs  Lab 08/05/20 2348  INR 1.1   Cardiac Enzymes: No results for input(s): CKTOTAL, CKMB, CKMBINDEX, TROPONINI in the last 168 hours. BNP (last 3 results) No results for input(s): PROBNP in the last 8760 hours. HbA1C: No results for input(s): HGBA1C in the last 72 hours. CBG: No results for input(s): GLUCAP in the last 168 hours. Lipid Profile: No results for input(s): CHOL, HDL, LDLCALC, TRIG, CHOLHDL, LDLDIRECT in the last 72 hours. Thyroid Function Tests: No  results for input(s): TSH, T4TOTAL, FREET4, T3FREE, THYROIDAB in the last 72 hours. Anemia Panel: No results for input(s): VITAMINB12, FOLATE, FERRITIN, TIBC, IRON, RETICCTPCT in the last 72 hours. Urine analysis:    Component Value Date/Time   COLORURINE YELLOW 11/29/2017 2002   APPEARANCEUR CLEAR 11/29/2017 2002   LABSPEC 1.014 11/29/2017 2002   PHURINE 5.0 11/29/2017 2002   GLUCOSEU NEGATIVE 11/29/2017 2002   HGBUR NEGATIVE 11/29/2017 2002   BILIRUBINUR NEGATIVE 11/29/2017 2002   KETONESUR NEGATIVE 11/29/2017 2002   PROTEINUR NEGATIVE 11/29/2017 2002   NITRITE NEGATIVE 11/29/2017 2002   LEUKOCYTESUR NEGATIVE 11/29/2017 2002   Sepsis Labs: @LABRCNTIP (procalcitonin:4,lacticidven:4) ) Recent Results (from the past 240 hour(s))  Resp Panel by RT-PCR (Flu A&B, Covid) Nasopharyngeal Swab     Status: None   Collection Time: 08/05/20 11:49 PM   Specimen: Nasopharyngeal Swab; Nasopharyngeal(NP) swabs in vial transport medium  Result Value Ref Range Status   SARS Coronavirus 2 by RT  PCR NEGATIVE NEGATIVE Final    Comment: (NOTE) SARS-CoV-2 target nucleic acids are NOT DETECTED.  The SARS-CoV-2 RNA is generally detectable in upper respiratory specimens during the acute phase of infection. The lowest concentration of SARS-CoV-2 viral copies this assay can detect is 138 copies/mL. A negative result does not preclude SARS-Cov-2 infection and should not be used as the sole basis for treatment or other patient management decisions. A negative result may occur with  improper specimen collection/handling, submission of specimen other than nasopharyngeal swab, presence of viral mutation(s) within the areas targeted by this assay, and inadequate number of viral copies(<138 copies/mL). A negative result must be combined with clinical observations, patient history, and epidemiological information. The expected result is Negative.  Fact Sheet for Patients:   EntrepreneurPulse.com.au  Fact Sheet for Healthcare Providers:  IncredibleEmployment.be  This test is no t yet approved or cleared by the [place] FDA and  has been authorized for detection and/or diagnosis of SARS-CoV-2 by FDA under an Emergency Use Authorization (EUA). This EUA will remain  in effect (meaning this test can be used) for the duration of the COVID-19 declaration under Section 564(b)(1) of the Act, 21 U.S.C.section 360bbb-3(b)(1), unless the authorization is terminated  or revoked sooner.       Influenza A by PCR NEGATIVE NEGATIVE Final   Influenza B by PCR NEGATIVE NEGATIVE Final    Comment: (NOTE) The Xpert Xpress SARS-CoV-2/FLU/RSV plus assay is intended as an aid in the diagnosis of influenza from Nasopharyngeal swab specimens and should not be used as a sole basis for treatment. Nasal washings and aspirates are unacceptable for Xpert Xpress SARS-CoV-2/FLU/RSV testing.  Fact Sheet for Patients: EntrepreneurPulse.com.au  Fact Sheet for Healthcare Providers: IncredibleEmployment.be  This test is not yet approved or cleared by the [place] FDA and has been authorized for detection and/or diagnosis of SARS-CoV-2 by FDA under an Emergency Use Authorization (EUA). This EUA will remain in effect (meaning this test can be used) for the duration of the COVID-19 declaration under Section 564(b)(1) of the Act, 21 U.S.C. section 360bbb-3(b)(1), unless the authorization is terminated or revoked.  Performed at [facility], [place] [address] [place], [place] [address]      Radiological Exams on Admission: CT HEAD WO CONTRAST  Result Date: 08/06/2020 CLINICAL DATA:  Acute neurologic deficit, aphasia EXAM: CT HEAD WITHOUT CONTRAST TECHNIQUE: Contiguous axial images were obtained from the base of the skull through the vertex without intravenous contrast. COMPARISON:  MRI 12/09/2017  FINDINGS: Brain: There is loss of gray-white matter differentiation and effacement of the overlying sulci involving the left frontal, temporal, and parietal lobes in keeping with a large left acute to subacute MCA territory infarct. No superimposed acute hemorrhage. No midline shift. Moderate periventricular white matter changes are present likely reflecting the sequela of small vessel ischemia. Mild parenchymal volume loss is commensurate with the patient's age. Ventricular size is normal. The cerebellum is unremarkable. Vascular: No asymmetric hyperdense vasculature at the skull base. There is extensive atherosclerotic calcification noted within the carotid siphons and distal vertebral arteries. Skull: Intact Sinuses/Orbits: Small mucous retention cyst noted within the left frontal and maxillary sinuses. Mild mucosal thickening within the a left sphenoid sinus and several ethmoid air cells bilaterally. No air-fluid levels. The orbits are unremarkable. Other: Mastoid air cells and middle ear cavities are clear. IMPRESSION: Large acute to subacute left MCA territory infarct involving the frontal, temporal, and parietal cortices. No superimposed acute hemorrhage. No midline shift. These results were  called by telephone at the time of interpretation on 08/06/2020 at 12:22 am to provider [facility] , who verbally acknowledged these results. Electronically Signed   By: Fidela Salisbury MD   On: 08/06/2020 00:27   DG Chest Port 1 View  Result Date: 08/06/2020 CLINICAL DATA:  Stroke, altered mental status EXAM: PORTABLE CHEST 1 VIEW COMPARISON:  11/29/2017 FINDINGS: Lungs volumes are small, but are symmetric and are clear. No pneumothorax or pleural effusion. Cardiac size within normal limits. Pulmonary vascularity is normal. Osseous structures are age-appropriate. No acute bone abnormality. IMPRESSION: No active disease. Electronically Signed   By: Fidela Salisbury MD   On: 08/06/2020 00:20    EKG: Independently  reviewed. Sinus rhythm, inferior ST-T abnormalities.   Assessment/Plan   1. Left MCA stroke; history of ischemic strokes - Presents with aphasia and right-sided weakness and is found on CT to have large MCA infarction    - Per patient's son an daughter at bedside, he would not want any further workup for this CVA and would want to be kept comfortable only   - Focus on keeping patient comfortable, consult TOC and palliative to help with disposition   2. Acute MI - Would not want any further workup or treatment per family     DVT prophylaxis: None, comfort measures only  Code Status: DNR, confirmed with family, paperwork at bedside Level of Care: Level of care: Telemetry Medical Family Communication: Son and daughter updated at bedside Disposition Plan:  Patient is from: Home  Anticipated d/c is to: TBD Anticipated d/c date is: 08/08/20 Patient currently: Pending   Consults called: Neurology  Admission status: Inpatient     Vianne Bulls, MD [facility]  08/06/2020, 2:00 AM

## 2020-08-06 NOTE — Progress Notes (Signed)
65 year old gentleman with history of CAD, cardiomyopathy and history of CVA with right-sided residual deficit and memory loss was brought into the emergency department with altered mental status and strokelike symptoms.  He was subsequently diagnosed with left MCA stroke and possibly non-ST elevation MI based off of significantly elevated troponin.  Extended and detailed discussion took place between the admitting hospitalist and the family and they decided to pursue only comfort care and no aggressive management.  Neurology was also consulted before admitting to the hospital service.  Since they chose to go comfort care, cardiology was not consulted.  Palliative care has been consulted and yet to be seen.  Patient seen and examined in the ED, he looks comfortable but slightly drowsy.  Despite of this, he is trying to follow his commands and able to move his left upper and lower extremity but not on the right side.  He is not speaking to me at all.  Opens eyes with conversations.

## 2020-08-06 NOTE — ED Triage Notes (Signed)
BIB [facility] for stroke-like symptoms of right sided weakness and global aphasia. LKN is 1530 on Sunday 08/04/20.  Son came home from work and pt wasn't talking as much as normal but was presumed to be tired and resting. Pt was discovered to be much weaker than normal and not talking as well as having been incontinent of urine this evening.

## 2020-08-06 NOTE — ED Notes (Signed)
Please Call son Meryl Dare (number in chart) about any changes or when pt gets a room

## 2020-08-06 NOTE — ED Notes (Signed)
Pt awake and moving a little bit, likes to hold pulse ox connector

## 2020-08-06 NOTE — ED Notes (Signed)
Lisa-PA and Dr. Myna Hidalgo notified of trop of 2170

## 2020-08-07 ENCOUNTER — Other Ambulatory Visit (HOSPITAL_COMMUNITY): Payer: Self-pay

## 2020-08-07 DIAGNOSIS — Z66 Do not resuscitate: Secondary | ICD-10-CM

## 2020-08-07 DIAGNOSIS — D72829 Elevated white blood cell count, unspecified: Secondary | ICD-10-CM

## 2020-08-07 DIAGNOSIS — I219 Acute myocardial infarction, unspecified: Secondary | ICD-10-CM

## 2020-08-07 DIAGNOSIS — Z515 Encounter for palliative care: Secondary | ICD-10-CM

## 2020-08-07 MED ORDER — MORPHINE SULFATE (CONCENTRATE) 20 MG/ML PO SOLN
5.0000 mg | ORAL | 0 refills | Status: AC | PRN
  Filled 2020-08-07: qty 30, 20d supply, fill #0

## 2020-08-07 MED ORDER — LORAZEPAM 1 MG PO TABS
1.0000 mg | ORAL_TABLET | ORAL | 0 refills | Status: AC | PRN
  Filled 2020-08-07: qty 60, 12d supply, fill #0

## 2020-08-07 MED ORDER — LORAZEPAM 2 MG/ML PO CONC
1.0000 mg | ORAL | 0 refills | Status: DC | PRN
  Filled 2020-08-07: qty 10, 4d supply, fill #0

## 2020-08-07 MED ORDER — LORAZEPAM 2 MG/ML PO CONC: 1.0000 mg | ORAL | Status: DC | PRN

## 2020-08-07 MED ORDER — LORAZEPAM 0.5 MG PO TABS
0.5000 mg | ORAL_TABLET | ORAL | 0 refills | Status: DC | PRN
  Filled 2020-08-07: qty 42, 9d supply, fill #0

## 2020-08-07 MED ORDER — HALOPERIDOL 5 MG PO TABS
5.0000 mg | ORAL_TABLET | ORAL | 0 refills | Status: DC | PRN
  Filled 2020-08-07: qty 42, 7d supply, fill #0

## 2020-08-07 MED ORDER — SENNOSIDES 8.6 MG PO TABS
2.0000 | ORAL_TABLET | Freq: Two times a day (BID) | ORAL | 0 refills | Status: AC
  Filled 2020-08-07: qty 28, 7d supply, fill #0

## 2020-08-07 MED ORDER — HYOSCYAMINE SULFATE 0.125 MG SL SUBL
0.1250 mg | SUBLINGUAL_TABLET | SUBLINGUAL | 0 refills | Status: AC | PRN
  Filled 2020-08-07: qty 42, 7d supply, fill #0

## 2020-08-07 MED ORDER — OXYCODONE HCL 5 MG PO TABS
5.0000 mg | ORAL_TABLET | ORAL | 0 refills | Status: DC | PRN
  Filled 2020-08-07: qty 42, 7d supply, fill #0

## 2020-08-07 MED ORDER — LORAZEPAM 1 MG PO TABS: 1.0000 mg | ORAL_TABLET | ORAL | 0 refills | Status: DC | PRN

## 2020-08-07 MED ORDER — PROCHLORPERAZINE MALEATE 10 MG PO TABS
10.0000 mg | ORAL_TABLET | ORAL | 0 refills | Status: AC | PRN
  Filled 2020-08-07: qty 42, 7d supply, fill #0

## 2020-08-07 MED ORDER — MORPHINE SULFATE (CONCENTRATE) 10 MG/0.5ML PO SOLN: 5.0000 mg | ORAL | Status: DC | PRN

## 2020-08-07 NOTE — Progress Notes (Signed)
Notified son Dorian that [name] is here to get him to take him home and he said he or his sister will be there to receive him.  Transported by cart.  IV access removed.

## 2020-08-07 NOTE — Plan of Care (Signed)
  Problem: Pain Managment: Goal: General experience of comfort will improve Outcome: Progressing   Problem: Safety: Goal: Ability to remain free from injury will improve Outcome: Progressing   Problem: Skin Integrity: Goal: Risk for impaired skin integrity will decrease Outcome: Progressing   

## 2020-08-07 NOTE — Discharge Summary (Signed)
Physician Discharge Summary  Garth Diffley URK:[id number] DOB: 1955-12-09 DOA: 08/05/2020  PCP: Heywood Bene, PA-C  Admit date: 08/05/2020 Discharge date: 08/07/2020  Admitted From: Home Disposition: Home with hospice  Brief/Interim Summary: James [name] is a 65 y.o.malewith medical history significant forcoronary artery disease, cardiomyopathy, history of CVA with right-sided motor deficits and memory loss, and chronic pain who presents the emergency department with right-sided weakness and speech difficulty. He is accompanied by his son and daughter who provide much of the history. Patient lives with his son who reports that the patient seemed to be in his usual state the afternoon of 08/04/2020 but then stayed in bed the following day, was noted to have urinary incontinence, and when his son tried to help bathe him, noticed that patient was unable to speak and not moving his right side. Patient had repeatedly discussed with his son that he would not want any aggressive treatments, had filled out DNR paperwork, and was being followed by palliative care. Patient's family was unsure of how to proceed initially, knowing that the patient would not want to be in the hospital, but unable to care for him in his current state, they eventually decided to call EMS for transport to the hospital.  ED Course:Upon arrival to the ED, patient is found to be slightly tachycardic, aphasic, and with right hemiparesis. EKG features inferior ST-T abnormalities. Chest x-ray negative for acute cardiopulmonary disease. Chemistry panel unremarkable. CBC with leukocytosis to 14,600. Head CT concerning for large left MCA territory infarction. High-sensitivity troponin was 2170. COVID-19 and influenza PCR negative. Neurology was consulted by the ED physician and hospitalists asked to admit.  Family has decided to stop any further work up and transition to hospice, comfort care.   Discharge Diagnoses:   Principal Problem:   Acute ischemic left MCA stroke [facility]) Active Problems:   History of CVA (cerebrovascular accident)   Coronary artery disease   Acute MI (HCC)   Leukocytosis   Comfort measures only status   DNR (do not resuscitate)   Discharge Instructions   Allergies as of 08/07/2020   No Known Allergies     Medication List    STOP taking these medications   amitriptyline 50 MG tablet Commonly known as: ELAVIL   gabapentin 600 MG tablet Commonly known as: NEURONTIN   mirtazapine 30 MG tablet Commonly known as: REMERON     TAKE these medications   haloperidol 5 MG tablet Commonly known as: HALDOL Take 1 tablet (5 mg total) by mouth every 4 (four) hours as needed for agitation. May crush, mix with water and give sublingually if needed.   hyoscyamine 0.125 MG SL tablet Commonly known as: LEVSIN SL Place 1 tablet (0.125 mg total) under the tongue every 4 (four) hours as needed (excess oral secretions).   LORazepam 0.5 MG tablet Commonly known as: ATIVAN Take 1 tablet (0.5 mg total) by mouth every 4 (four) hours as needed for anxiety. May crush, mix with water and give sublingually if needed.   oxyCODONE 5 MG immediate release tablet Commonly known as: Oxy IR/ROXICODONE Take 1 tablet (5 mg total) by mouth every 4 (four) hours as needed for severe pain. May crush, mix with water and give sublingually if needed.   prochlorperazine 10 MG tablet Commonly known as: COMPAZINE Take 1 tablet (10 mg total) by mouth every 4 (four) hours as needed for nausea or vomiting. May crush, mix with water and give sublingually.   senna 8.6 MG tablet Commonly known as: Senokot  Take 2 tablets (17.2 mg total) by mouth 2 (two) times daily. May crush, mix with water and give sublingually if needed.       No Known Allergies  Consultations:  Neurology  Palliative care medicine   Procedures/Studies: CT HEAD WO CONTRAST  Result Date: 08/06/2020 CLINICAL DATA:  Acute  neurologic deficit, aphasia EXAM: CT HEAD WITHOUT CONTRAST TECHNIQUE: Contiguous axial images were obtained from the base of the skull through the vertex without intravenous contrast. COMPARISON:  MRI 12/09/2017 FINDINGS: Brain: There is loss of gray-white matter differentiation and effacement of the overlying sulci involving the left frontal, temporal, and parietal lobes in keeping with a large left acute to subacute MCA territory infarct. No superimposed acute hemorrhage. No midline shift. Moderate periventricular white matter changes are present likely reflecting the sequela of small vessel ischemia. Mild parenchymal volume loss is commensurate with the patient's age. Ventricular size is normal. The cerebellum is unremarkable. Vascular: No asymmetric hyperdense vasculature at the skull base. There is extensive atherosclerotic calcification noted within the carotid siphons and distal vertebral arteries. Skull: Intact Sinuses/Orbits: Small mucous retention cyst noted within the left frontal and maxillary sinuses. Mild mucosal thickening within the a left sphenoid sinus and several ethmoid air cells bilaterally. No air-fluid levels. The orbits are unremarkable. Other: Mastoid air cells and middle ear cavities are clear. IMPRESSION: Large acute to subacute left MCA territory infarct involving the frontal, temporal, and parietal cortices. No superimposed acute hemorrhage. No midline shift. These results were called by telephone at the time of interpretation on 08/06/2020 at 12:22 am to provider [facility] , who verbally acknowledged these results. Electronically Signed   By: Fidela Salisbury MD   On: 08/06/2020 00:27   DG Chest Port 1 View  Result Date: 08/06/2020 CLINICAL DATA:  Stroke, altered mental status EXAM: PORTABLE CHEST 1 VIEW COMPARISON:  11/29/2017 FINDINGS: Lungs volumes are small, but are symmetric and are clear. No pneumothorax or pleural effusion. Cardiac size within normal limits. Pulmonary  vascularity is normal. Osseous structures are age-appropriate. No acute bone abnormality. IMPRESSION: No active disease. Electronically Signed   By: Fidela Salisbury MD   On: 08/06/2020 00:20       Discharge Exam: Vitals:   08/06/20 2351 08/07/20 0749  BP: 119/85 124/88  Pulse: (!) 102 92  Resp: 16 18  Temp: 98.9 F (37.2 C) 97.8 F (36.6 C)  SpO2: 97% 97%   General exam: Appears calm and comfortable  Respiratory system: Clear to auscultation. Respiratory effort normal. No respiratory distress. Cardiovascular system: S1 & S2 heard, RRR. No murmurs. No pedal edema. Gastrointestinal system: Abdomen is nondistended, soft and nontender.  Central nervous system: Alert to voice, spontaneous movement of the left lower and upper extremity.  Does not interact or answer questions. Extremities: Symmetric in appearance  Skin: No rashes, lesions or ulcers on exposed skin    The results of significant diagnostics from this hospitalization (including imaging, microbiology, ancillary and laboratory) are listed below for reference.     Microbiology: Recent Results (from the past 240 hour(s))  Resp Panel by RT-PCR (Flu A&B, Covid) Nasopharyngeal Swab     Status: None   Collection Time: 08/05/20 11:49 PM   Specimen: Nasopharyngeal Swab; Nasopharyngeal(NP) swabs in vial transport medium  Result Value Ref Range Status   SARS Coronavirus 2 by RT PCR NEGATIVE NEGATIVE Final    Comment: (NOTE) SARS-CoV-2 target nucleic acids are NOT DETECTED.  The SARS-CoV-2 RNA is generally detectable in upper respiratory specimens during the  acute phase of infection. The lowest concentration of SARS-CoV-2 viral copies this assay can detect is 138 copies/mL. A negative result does not preclude SARS-Cov-2 infection and should not be used as the sole basis for treatment or other patient management decisions. A negative result may occur with  improper specimen collection/handling, submission of specimen other than  nasopharyngeal swab, presence of viral mutation(s) within the areas targeted by this assay, and inadequate number of viral copies(<138 copies/mL). A negative result must be combined with clinical observations, patient history, and epidemiological information. The expected result is Negative.  Fact Sheet for Patients:  EntrepreneurPulse.com.au  Fact Sheet for Healthcare Providers:  IncredibleEmployment.be  This test is no t yet approved or cleared by the [place] FDA and  has been authorized for detection and/or diagnosis of SARS-CoV-2 by FDA under an Emergency Use Authorization (EUA). This EUA will remain  in effect (meaning this test can be used) for the duration of the COVID-19 declaration under Section 564(b)(1) of the Act, 21 U.S.C.section 360bbb-3(b)(1), unless the authorization is terminated  or revoked sooner.       Influenza A by PCR NEGATIVE NEGATIVE Final   Influenza B by PCR NEGATIVE NEGATIVE Final    Comment: (NOTE) The Xpert Xpress SARS-CoV-2/FLU/RSV plus assay is intended as an aid in the diagnosis of influenza from Nasopharyngeal swab specimens and should not be used as a sole basis for treatment. Nasal washings and aspirates are unacceptable for Xpert Xpress SARS-CoV-2/FLU/RSV testing.  Fact Sheet for Patients: EntrepreneurPulse.com.au  Fact Sheet for Healthcare Providers: IncredibleEmployment.be  This test is not yet approved or cleared by the [place] FDA and has been authorized for detection and/or diagnosis of SARS-CoV-2 by FDA under an Emergency Use Authorization (EUA). This EUA will remain in effect (meaning this test can be used) for the duration of the COVID-19 declaration under Section 564(b)(1) of the Act, 21 U.S.C. section 360bbb-3(b)(1), unless the authorization is terminated or revoked.  Performed at [facility], [place] [address], [place], [place] [address]       Labs: BNP (last 3 results) No results for input(s): BNP in the last 8760 hours. Basic Metabolic Panel: Recent Labs  Lab 08/05/20 2348 08/06/20 0011  NA 136 137  K 4.0 4.0  CL 100 102  CO2 22  --   GLUCOSE 115* 109*  BUN 12 17  CREATININE 1.05 0.80  CALCIUM 9.0  --    Liver Function Tests: Recent Labs  Lab 08/05/20 2348  AST 30  ALT 46*  ALKPHOS 112  BILITOT 1.6*  PROT 7.6  ALBUMIN 3.7   No results for input(s): LIPASE, AMYLASE in the last 168 hours. No results for input(s): AMMONIA in the last 168 hours. CBC: Recent Labs  Lab 08/05/20 2348 08/06/20 0011  WBC 14.6*  --   NEUTROABS 11.8*  --   HGB 16.6 16.7  HCT 48.6 49.0  MCV 90.7  --   PLT 300  --    Cardiac Enzymes: No results for input(s): CKTOTAL, CKMB, CKMBINDEX, TROPONINI in the last 168 hours. BNP: Invalid input(s): POCBNP CBG: Recent Labs  Lab 08/05/20 2348  GLUCAP 121*   D-Dimer No results for input(s): DDIMER in the last 72 hours. Hgb A1c No results for input(s): HGBA1C in the last 72 hours. Lipid Profile No results for input(s): CHOL, HDL, LDLCALC, TRIG, CHOLHDL, LDLDIRECT in the last 72 hours. Thyroid function studies No results for input(s): TSH, T4TOTAL, T3FREE, THYROIDAB in the last 72 hours.  Invalid  input(s): FREET3 Anemia work up No results for input(s): VITAMINB12, FOLATE, FERRITIN, TIBC, IRON, RETICCTPCT in the last 72 hours. Urinalysis    Component Value Date/Time   COLORURINE YELLOW 11/29/2017 2002   APPEARANCEUR CLEAR 11/29/2017 2002   LABSPEC 1.014 11/29/2017 2002   PHURINE 5.0 11/29/2017 2002   GLUCOSEU NEGATIVE 11/29/2017 2002   HGBUR NEGATIVE 11/29/2017 2002   [place] NEGATIVE 11/29/2017 2002   KETONESUR NEGATIVE 11/29/2017 2002   PROTEINUR NEGATIVE 11/29/2017 2002   NITRITE NEGATIVE 11/29/2017 2002   LEUKOCYTESUR NEGATIVE 11/29/2017 2002   Sepsis Labs Invalid input(s): PROCALCITONIN,  WBC,  LACTICIDVEN Microbiology Recent Results (from the past 240  hour(s))  Resp Panel by RT-PCR (Flu A&B, Covid) Nasopharyngeal Swab     Status: None   Collection Time: 08/05/20 11:49 PM   Specimen: Nasopharyngeal Swab; Nasopharyngeal(NP) swabs in vial transport medium  Result Value Ref Range Status   SARS Coronavirus 2 by RT PCR NEGATIVE NEGATIVE Final    Comment: (NOTE) SARS-CoV-2 target nucleic acids are NOT DETECTED.  The SARS-CoV-2 RNA is generally detectable in upper respiratory specimens during the acute phase of infection. The lowest concentration of SARS-CoV-2 viral copies this assay can detect is 138 copies/mL. A negative result does not preclude SARS-Cov-2 infection and should not be used as the sole basis for treatment or other patient management decisions. A negative result may occur with  improper specimen collection/handling, submission of specimen other than nasopharyngeal swab, presence of viral mutation(s) within the areas targeted by this assay, and inadequate number of viral copies(<138 copies/mL). A negative result must be combined with clinical observations, patient history, and epidemiological information. The expected result is Negative.  Fact Sheet for Patients:  EntrepreneurPulse.com.au  Fact Sheet for Healthcare Providers:  IncredibleEmployment.be  This test is no t yet approved or cleared by the [place] FDA and  has been authorized for detection and/or diagnosis of SARS-CoV-2 by FDA under an Emergency Use Authorization (EUA). This EUA will remain  in effect (meaning this test can be used) for the duration of the COVID-19 declaration under Section 564(b)(1) of the Act, 21 U.S.C.section 360bbb-3(b)(1), unless the authorization is terminated  or revoked sooner.       Influenza A by PCR NEGATIVE NEGATIVE Final   Influenza B by PCR NEGATIVE NEGATIVE Final    Comment: (NOTE) The Xpert Xpress SARS-CoV-2/FLU/RSV plus assay is intended as an aid in the diagnosis of influenza from  Nasopharyngeal swab specimens and should not be used as a sole basis for treatment. Nasal washings and aspirates are unacceptable for Xpert Xpress SARS-CoV-2/FLU/RSV testing.  Fact Sheet for Patients: EntrepreneurPulse.com.au  Fact Sheet for Healthcare Providers: IncredibleEmployment.be  This test is not yet approved or cleared by the [place] FDA and has been authorized for detection and/or diagnosis of SARS-CoV-2 by FDA under an Emergency Use Authorization (EUA). This EUA will remain in effect (meaning this test can be used) for the duration of the COVID-19 declaration under Section 564(b)(1) of the Act, 21 U.S.C. section 360bbb-3(b)(1), unless the authorization is terminated or revoked.  Performed at [facility], [place] [address], [place], [place] [address]      Patient was seen and examined on the day of discharge and was found to be in stable condition. Time coordinating discharge: 35 minutes including assessment and coordination of care, as well as examination of the patient.   SIGNED:  Dessa Phi, DO [facility] 08/07/2020, 3:38 PM

## 2020-08-07 NOTE — Progress Notes (Signed)
PROGRESS NOTE    James [name]  IHK:[id number] DOB: Jun 02, 1955 DOA: 08/05/2020 PCP: Heywood Bene, PA-C     Brief Narrative:  James [name] is a 65 y.o. 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  He is accompanied by his son and daughter who provide much of the history.  Patient lives with his son who reports that the patient seemed to be in his usual state the afternoon of 08/04/2020 but then stayed in bed the following day, was noted to have urinary incontinence, and when his son tried to help bathe him, noticed that patient was unable to speak and not moving his right side.  Patient had repeatedly discussed with his son that he would not want any aggressive treatments, had filled out DNR paperwork, and was being followed by palliative care.  Patient's family was unsure of how to proceed initially, knowing that the patient would not want to be in the hospital, but unable to care for him in his current state, they eventually decided to call EMS for transport to the hospital.  ED Course: Upon arrival to the ED, patient is found to be slightly tachycardic, aphasic, and with right hemiparesis.  EKG features inferior ST-T abnormalities.  Chest x-ray negative for acute cardiopulmonary disease.  Chemistry panel unremarkable.  CBC with leukocytosis to 14,600.  Head CT concerning for large left MCA territory infarction.  High-sensitivity troponin was 2170.  COVID-19 and influenza PCR negative.  Neurology was consulted by the ED physician and hospitalists asked to admit.  Family has decided to stop any further work up and transition to hospice, comfort care.   New events last 24 hours / Subjective: No acute events reported overnight.  Patient is alert to voice, is able to spontaneously move his  left body but does not interact, answer question or follow any directions.  Assessment & Plan:   Principal Problem:   Acute ischemic left MCA stroke (HCC) Active Problems:   History of CVA (cerebrovascular accident)   Coronary artery disease   Acute MI (HCC)   Leukocytosis   Comfort measures only status   DNR (do not resuscitate)  Comfort care.  Home hospice and DME delivery pending discharge.   Code Status:     Code Status Orders  (From admission, onward)         Start     Ordered   08/06/20 0156  Do not attempt resuscitation (DNR)  Continuous       Question Answer Comment  In the event of cardiac or respiratory ARREST Do not call a "code blue"   In the event of cardiac or respiratory ARREST Do not perform Intubation, CPR, defibrillation or ACLS   In the event of cardiac or respiratory ARREST Use medication by any route, position, wound care, and other measures to relive pain and suffering. May use oxygen, suction and manual treatment of airway obstruction as needed for comfort.      08/06/20 0159        Code Status History    Date Active Date Inactive Code Status Order ID Comments User Context   10/14/2018 1410 10/15/2018 1105 Full Code [id number]  Leta Baptist, MD Inpatient   11/24/2017 0331 11/25/2017 2112 Full Code [id number]  Vianne Bulls, MD Inpatient   Advance Care Planning Activity     Family Communication: None at  bedside  Disposition Plan:  Status is: Inpatient  Remains inpatient appropriate because:Unsafe d/c plan   Dispo: The patient is from: Home              Anticipated d/c is to: Home hospice              Patient currently is medically stable to d/c.   Difficult to place patient No    Antimicrobials:  Anti-infectives (From admission, onward)   None        Objective: Vitals:   08/06/20 2012 08/06/20 2059 08/06/20 2351 08/07/20 0749  BP: 119/86  119/85 124/88  Pulse: (!) 106  (!) 102 92  Resp: 16  16 18   Temp: 100.2 F (37.9 C) 98.9 F  (37.2 C) 98.9 F (37.2 C) 97.8 F (36.6 C)  TempSrc: Oral Oral  Oral  SpO2: 100%  97% 97%    Intake/Output Summary (Last 24 hours) at 08/07/2020 1438 Last data filed at 08/07/2020 3762 Gross per 24 hour  Intake --  Output 400 ml  Net -400 ml   There were no vitals filed for this visit.  Examination: General exam: Appears calm and comfortable  Respiratory system: Clear to auscultation. Respiratory effort normal. No respiratory distress. Cardiovascular system: S1 & S2 heard, RRR. No murmurs. No pedal edema. Gastrointestinal system: Abdomen is nondistended, soft and nontender.  Central nervous system: Alert to voice, spontaneous movement of the left lower and upper extremity.  Does not interact or answer questions. Extremities: Symmetric in appearance  Skin: No rashes, lesions or ulcers on exposed skin   Data Reviewed: I have personally reviewed following labs and imaging studies  CBC: Recent Labs  Lab 08/05/20 2348 08/06/20 0011  WBC 14.6*  --   NEUTROABS 11.8*  --   HGB 16.6 16.7  HCT 48.6 49.0  MCV 90.7  --   PLT 300  --    Basic Metabolic Panel: Recent Labs  Lab 08/05/20 2348 08/06/20 0011  NA 136 137  K 4.0 4.0  CL 100 102  CO2 22  --   GLUCOSE 115* 109*  BUN 12 17  CREATININE 1.05 0.80  CALCIUM 9.0  --    GFR: CrCl cannot be calculated (Unknown ideal weight.). Liver Function Tests: Recent Labs  Lab 08/05/20 2348  AST 30  ALT 46*  ALKPHOS 112  BILITOT 1.6*  PROT 7.6  ALBUMIN 3.7   No results for input(s): LIPASE, AMYLASE in the last 168 hours. No results for input(s): AMMONIA in the last 168 hours. Coagulation Profile: Recent Labs  Lab 08/05/20 2348  INR 1.1   Cardiac Enzymes: No results for input(s): CKTOTAL, CKMB, CKMBINDEX, TROPONINI in the last 168 hours. BNP (last 3 results) No results for input(s): PROBNP in the last 8760 hours. HbA1C: No results for input(s): HGBA1C in the last 72 hours. CBG: Recent Labs  Lab 08/05/20 2348   GLUCAP 121*   Lipid Profile: No results for input(s): CHOL, HDL, LDLCALC, TRIG, CHOLHDL, LDLDIRECT in the last 72 hours. Thyroid Function Tests: No results for input(s): TSH, T4TOTAL, FREET4, T3FREE, THYROIDAB in the last 72 hours. Anemia Panel: No results for input(s): VITAMINB12, FOLATE, FERRITIN, TIBC, IRON, RETICCTPCT in the last 72 hours. Sepsis Labs: No results for input(s): PROCALCITON, LATICACIDVEN in the last 168 hours.  Recent Results (from the past 240 hour(s))  Resp Panel by RT-PCR (Flu A&B, Covid) Nasopharyngeal Swab     Status: None   Collection Time: 08/05/20 11:49 PM   Specimen: Nasopharyngeal Swab;  Nasopharyngeal(NP) swabs in vial transport medium  Result Value Ref Range Status   SARS Coronavirus 2 by RT PCR NEGATIVE NEGATIVE Final    Comment: (NOTE) SARS-CoV-2 target nucleic acids are NOT DETECTED.  The SARS-CoV-2 RNA is generally detectable in upper respiratory specimens during the acute phase of infection. The lowest concentration of SARS-CoV-2 viral copies this assay can detect is 138 copies/mL. A negative result does not preclude SARS-Cov-2 infection and should not be used as the sole basis for treatment or other patient management decisions. A negative result may occur with  improper specimen collection/handling, submission of specimen other than nasopharyngeal swab, presence of viral mutation(s) within the areas targeted by this assay, and inadequate number of viral copies(<138 copies/mL). A negative result must be combined with clinical observations, patient history, and epidemiological information. The expected result is Negative.  Fact Sheet for Patients:  EntrepreneurPulse.com.au  Fact Sheet for Healthcare Providers:  IncredibleEmployment.be  This test is no t yet approved or cleared by the [place] FDA and  has been authorized for detection and/or diagnosis of SARS-CoV-2 by FDA under an Emergency Use  Authorization (EUA). This EUA will remain  in effect (meaning this test can be used) for the duration of the COVID-19 declaration under Section 564(b)(1) of the Act, 21 U.S.C.section 360bbb-3(b)(1), unless the authorization is terminated  or revoked sooner.       Influenza A by PCR NEGATIVE NEGATIVE Final   Influenza B by PCR NEGATIVE NEGATIVE Final    Comment: (NOTE) The Xpert Xpress SARS-CoV-2/FLU/RSV plus assay is intended as an aid in the diagnosis of influenza from Nasopharyngeal swab specimens and should not be used as a sole basis for treatment. Nasal washings and aspirates are unacceptable for Xpert Xpress SARS-CoV-2/FLU/RSV testing.  Fact Sheet for Patients: EntrepreneurPulse.com.au  Fact Sheet for Healthcare Providers: IncredibleEmployment.be  This test is not yet approved or cleared by the [place] FDA and has been authorized for detection and/or diagnosis of SARS-CoV-2 by FDA under an Emergency Use Authorization (EUA). This EUA will remain in effect (meaning this test can be used) for the duration of the COVID-19 declaration under Section 564(b)(1) of the Act, 21 U.S.C. section 360bbb-3(b)(1), unless the authorization is terminated or revoked.  Performed at [facility], [place] [address], [place], [place] [address]       Radiology Studies: CT HEAD WO CONTRAST  Result Date: 08/06/2020 CLINICAL DATA:  Acute neurologic deficit, aphasia EXAM: CT HEAD WITHOUT CONTRAST TECHNIQUE: Contiguous axial images were obtained from the base of the skull through the vertex without intravenous contrast. COMPARISON:  MRI 12/09/2017 FINDINGS: Brain: There is loss of gray-white matter differentiation and effacement of the overlying sulci involving the left frontal, temporal, and parietal lobes in keeping with a large left acute to subacute MCA territory infarct. No superimposed acute hemorrhage. No midline shift. Moderate periventricular  white matter changes are present likely reflecting the sequela of small vessel ischemia. Mild parenchymal volume loss is commensurate with the patient's age. Ventricular size is normal. The cerebellum is unremarkable. Vascular: No asymmetric hyperdense vasculature at the skull base. There is extensive atherosclerotic calcification noted within the carotid siphons and distal vertebral arteries. Skull: Intact Sinuses/Orbits: Small mucous retention cyst noted within the left frontal and maxillary sinuses. Mild mucosal thickening within the a left sphenoid sinus and several ethmoid air cells bilaterally. No air-fluid levels. The orbits are unremarkable. Other: Mastoid air cells and middle ear cavities are clear. IMPRESSION: Large acute to subacute left MCA territory  infarct involving the frontal, temporal, and parietal cortices. No superimposed acute hemorrhage. No midline shift. These results were called by telephone at the time of interpretation on 08/06/2020 at 12:22 am to provider [facility] , who verbally acknowledged these results. Electronically Signed   By: Fidela Salisbury MD   On: 08/06/2020 00:27   DG Chest Port 1 View  Result Date: 08/06/2020 CLINICAL DATA:  Stroke, altered mental status EXAM: PORTABLE CHEST 1 VIEW COMPARISON:  11/29/2017 FINDINGS: Lungs volumes are small, but are symmetric and are clear. No pneumothorax or pleural effusion. Cardiac size within normal limits. Pulmonary vascularity is normal. Osseous structures are age-appropriate. No acute bone abnormality. IMPRESSION: No active disease. Electronically Signed   By: Fidela Salisbury MD   On: 08/06/2020 00:20      Scheduled Meds: . nicotine  14 mg Transdermal Daily   Continuous Infusions: . chlorproMAZINE (THORAZINE) IV       LOS: 1 day      Time spent: 20 minutes   Dessa Phi, DO [facility] 08/07/2020, 2:38 PM   Available via Epic secure chat 7am-7pm After these hours, please refer to coverage provider  listed on [email or web address]

## 2020-08-07 NOTE — TOC Transition Note (Signed)
Transition of Care [facility]) - CM/SW Discharge Note   Patient Details  Name: James [name] MRN: [id number] Date of Birth: 01/29/1956  Transition of Care [facility]) CM/SW Contact:  James Friar, RN Phone Number: 08/07/2020, 3:59 PM   Clinical Narrative:    Patient is discharging home with hospice services through [facility] that will start in the am. CM has updated James [name] with [facility] on d/c. [name] is in agreement with d/c home tonight. PTAR arranged. CM met with daughter at the bedside and she also agrees with d/c home tonight. Bed for home has been delivered to the home. Bedside RN updated. D/c medications to be delivered to the room per James [name].   Final next level of care: Home w Hospice Care Barriers to Discharge: No Barriers Identified   Patient Goals and CMS Choice   CMS Medicare.gov Compare Post Acute Care list provided to:: Patient Represenative (must comment) Choice offered to / list presented to : Adult Children  Discharge Placement                       Discharge Plan and Services   Discharge Planning Services: CM Consult Post Acute Care Choice: James [name]          DME Arranged: Hospital bed           [place]: Hospice and [place] Date [place]: 08/07/20   Representative spoke with at James [name]: James [name] Determinants of Health (James [name]) Interventions     Readmission Risk Interventions No flowsheet data found.

## 2020-08-07 NOTE — TOC Initial Note (Signed)
Transition of Care [facility]) - Initial/Assessment Note    Patient Details  Name: James [name] MRN: [id number] Date of Birth: 1955-07-25  Transition of Care [facility]) CM/SW Contact:    Pollie Friar, RN Phone Number: 08/07/2020, 10:53 AM  Clinical Narrative:                 Plan is for home with hospice today once the DME is delivered to the home. CM spoke to patients son: Meryl Dare and he has not received a phone call from the [place] yet this am. CM has updated Bevely Palmer with [place].  TOC following.  Expected Discharge Plan: Home w Hospice Care Barriers to Discharge: Continued Medical Work up   Patient Goals and CMS Choice   CMS Medicare.gov Compare Post Acute Care list provided to:: Patient Represenative (must comment) Choice offered to / list presented to : Adult Children  Expected Discharge Plan and Services Expected Discharge Plan: Home w Hospice Care   Discharge Planning Services: CM Consult Post Acute Care Choice: Durable Medical Equipment,Hospice Living arrangements for the past 2 months: [place]                 DME Arranged: Hospital bed           [place]: Hospice and [place] Date [place]: 08/07/20   Representative spoke with at [place]: Bevely Palmer  Prior Living Arrangements/Services Living arrangements for the past 2 months: [place] Lives with:: Adult Children          Need for Family Participation in Patient Care: Yes (Comment) Care giver support system in place?: Yes (comment)   Criminal Activity/Legal Involvement Pertinent to Current Situation/Hospitalization: No - Comment as needed  Activities of Daily Living      Permission Sought/Granted                  Emotional Assessment Appearance:: Appears stated age         Psych Involvement: No (comment)  Admission diagnosis:  Acute ischemic left MCA stroke [facility]) [I63.512] Patient Active Problem List   Diagnosis Date Noted  .  Acute ischemic left MCA stroke ([place]) 08/06/2020  . History of parotidectomy 10/14/2018  . Coronary artery disease 09/12/2018  . History of MI (myocardial infarction) 09/12/2018  . Acute encephalopathy 11/24/2017  . Mild renal insufficiency 11/24/2017  . History of CVA (cerebrovascular accident) 11/24/2017  . Parotid swelling 11/24/2017   PCP:  Heywood Bene, PA-C Pharmacy:   [facility] [place], [place] [address] [place] [address] Phone: [phone] Fax: [phone]  [facility] - [place], [place] - [address] [place] [address] Phone: [phone] Fax: [phone]     Social Determinants of Health ([place]) Interventions    Readmission Risk Interventions No flowsheet data found.

## 2020-08-07 NOTE — Progress Notes (Signed)
Patient awaiting transport via PTAR. Discharge information and meds from [place] at nurses station to be sent with transport. IV will be left in place until patient leaves for administration of any needed medication.

## 2020-08-07 NOTE — Progress Notes (Signed)
Daily Progress Note   Patient Name: James [name]       Date: 08/07/2020 DOB: [birth year]  Age: 65 y.o. MRN#: [id number] Attending Physician: Dessa Phi, DO Primary Care Physician: Merwyn Katos Admit Date: 08/05/2020  Reason for Consultation/Follow-up: symptom management, end of life care  Subjective: 13:15--Patient appears comfortable. He is sleeping when I enter the room, but awakens easily to voice. No non-verbal signs of pain or discomfort noted. Respirations are even and unlabored. No excessive respiratory secretions noted.   No family present at bedside. I spoke with son/James [name] by phone. He reports the bed has been delivered yet and is adamant his father not be discharged until bed has been delivered. Provided reassurance that I would reach out to hospice liaisson and makes ure they are aware.   I spoke with [place] - she is aware of the bed issue and has taken steps to push for delivery today.   16:25--Daughter at bedside. We discussed that patient is being sent home with comfort medications. She expresses concern about administering a pill, even crushed. I offer to change the medications to morphine solution and ativan solution which can be administered sublingually safely to patients who can't swallow.   I was later notified that [facility] does not stock ativan solution. Rx changed back to ativan tablets by Dr. Maylene Roes. Family to be advised that ativan tablets can be placed under the tongue and allowed to dissolve.   Length of Stay: 1  Current Medications: Scheduled Meds:  . nicotine  14 mg Transdermal Daily    Continuous Infusions: . chlorproMAZINE (THORAZINE) IV      PRN Meds: acetaminophen **OR** acetaminophen, antiseptic oral rinse,  bisacodyl, chlorproMAZINE (THORAZINE) IV, glycopyrrolate **OR** glycopyrrolate **OR** glycopyrrolate, haloperidol **OR** haloperidol **OR** haloperidol lactate, LORazepam **OR** [DISCONTINUED] LORazepam **OR** LORazepam, LORazepam, morphine injection, ondansetron **OR** ondansetron (ZOFRAN) IV, polyvinyl alcohol  Physical Exam Vitals reviewed.  Constitutional:      General: He is not in acute distress.    Appearance: He is ill-appearing.  Pulmonary:     Effort: Pulmonary effort is normal.  Neurological:     Mental Status: He is lethargic.     Motor: Weakness present.  Psychiatric:        Speech: He is noncommunicative.  Vital Signs: BP 124/88 (BP Location: Right Arm)   Pulse 92   Temp 97.8 F (36.6 C) (Oral)   Resp 18   SpO2 97%  SpO2: SpO2: 97 % O2 Device: O2 Device: Room Air O2 Flow Rate: O2 Flow Rate (L/min): 2 L/min  Intake/output summary:   Intake/Output Summary (Last 24 hours) at 08/07/2020 1317 Last data filed at 08/07/2020 1740 Gross per 24 hour  Intake --  Output 400 ml  Net -400 ml   LBM: Last BM Date:  (PTA) Baseline Weight:   Most recent weight:         Palliative Assessment/Data: PPS 10%       Patient Active Problem List   Diagnosis Date Noted  . Acute ischemic left MCA stroke ([place]) 08/06/2020  . History of parotidectomy 10/14/2018  . Coronary artery disease 09/12/2018  . History of MI (myocardial infarction) 09/12/2018  . Acute encephalopathy 11/24/2017  . Mild renal insufficiency 11/24/2017  . History of CVA (cerebrovascular accident) 11/24/2017  . Parotid swelling 11/24/2017    Palliative Care Assessment & Plan   HPI/Patient Profile: 65 y.o. male  with past medical history of CVA with residual right sided weakness, coronary artery disease, cardiomyopathy, and chronic pain who presented to the emergency department on 08/05/2020 with right-sided weakness and aphasia.  ED Course: Patient found to be tachycardic, aphasic, and with  right hemiparesis. Head CT concerning for large left MCA territory infarction. Neurology consulted and [facility] was called to admit.   Assessment: - acute left MCA stroke - acute MI - history of ischemic stroke - aphasia - right sided hemiparesis - end of life care  Recommendations/Plan:  Continue full comfort measures  DNR/DNI as previosuly documented (gold form at bedside)  Plan for discharge home with hospice today after bed is delivered  Patient to be sent home with comfort meds - Morphine concentrate solution and ativan tablets   Goals of Care and Additional Recommendations:  Limitations on Scope of Treatment: Full Comfort Care  Code Status:  DNR/DNI  Prognosis:   < 2 weeks  Discharge Planning:  Home with Hospice  Care plan was discussed with bedside RN, TOC, hospice liaison  Thank you for allowing the Palliative Medicine Team to assist in the care of this patient.   Total Time 35 minutes Prolonged Time Billed  no      Greater than 50%  of this time was spent counseling and coordinating care related to the above assessment and plan.  Lavena Bullion, NP  Please contact Palliative Medicine Team phone at [phone] for questions and concerns.

## 2020-08-08 ENCOUNTER — Other Ambulatory Visit (HOSPITAL_COMMUNITY): Payer: Self-pay

## 2023-01-08 IMAGING — CT CT HEAD W/O CM
3 series · 15 of 47 positions shown, 18 images · non-contrast
Comparison: MRI 12/09/2017

CLINICAL DATA: Acute neurologic deficit, aphasia

EXAM:
CT HEAD WITHOUT CONTRAST
TECHNIQUE: Contiguous axial images were obtained from the base of the skull
through the vertex without intravenous contrast.

[Series 3: head 5.0 h30s · axial · 0.42mm/px · z∈[-104,+56]mm · 9 of 38 slices shown, 12 images]
[im 3/38  brain]
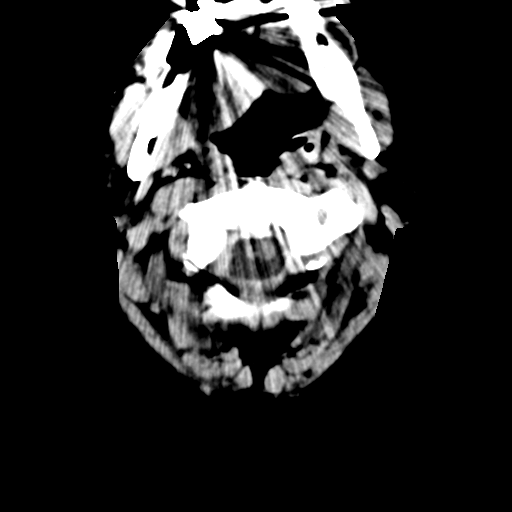
[im 3/38  bone]
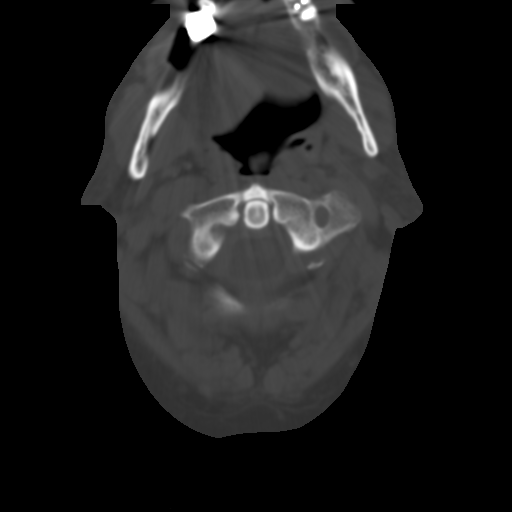
[im 7/38  brain]
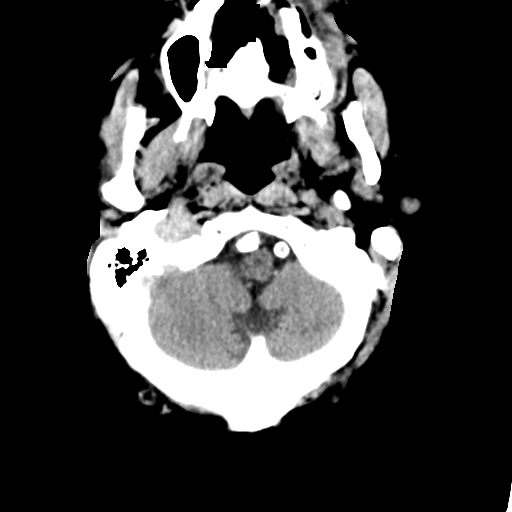
[im 11/38  brain]
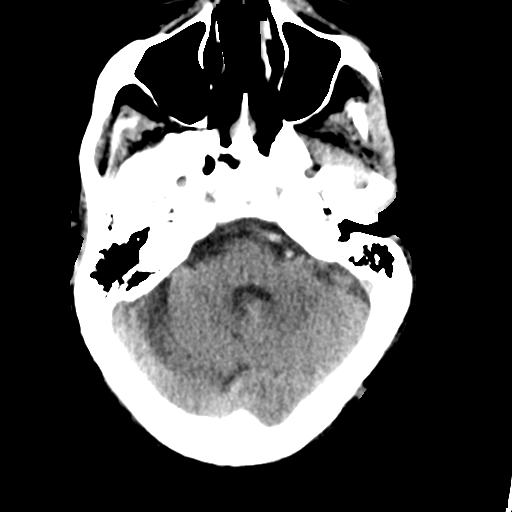
[im 15/38  brain]
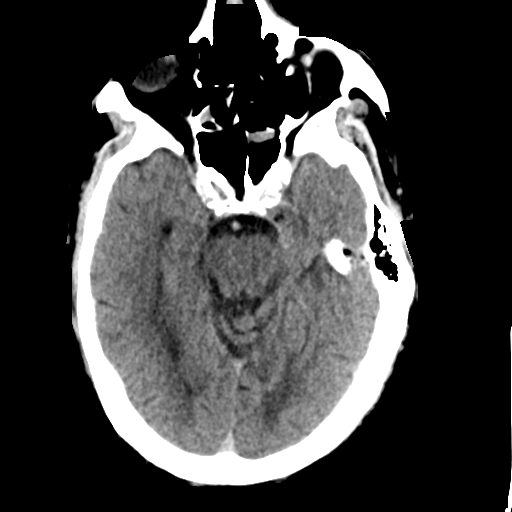
[im 20/38  brain]
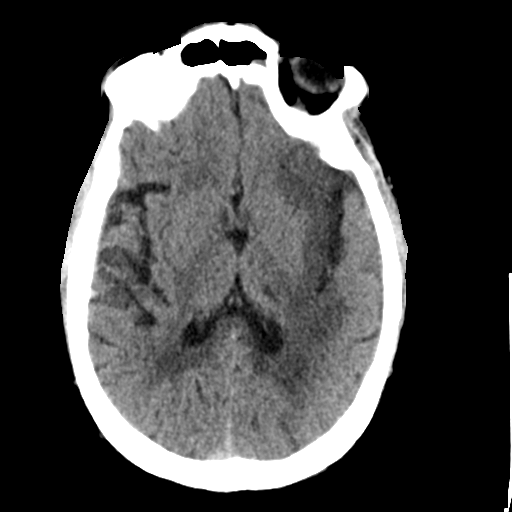
[im 20/38  bone]
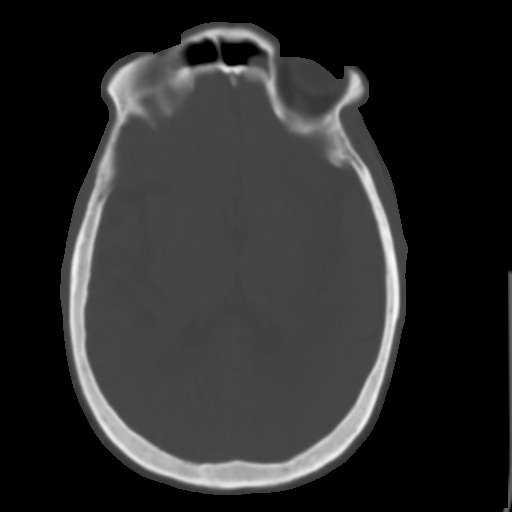
[im 23/38  brain]
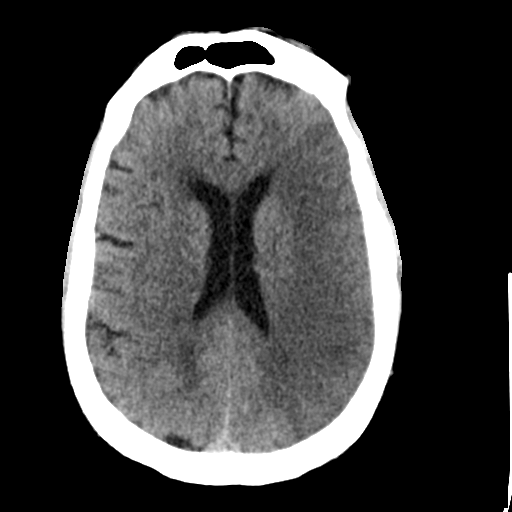
[im 27/38  brain]
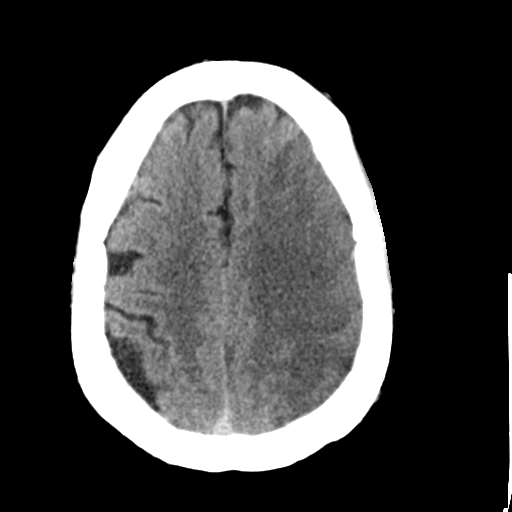
[im 31/38  brain]
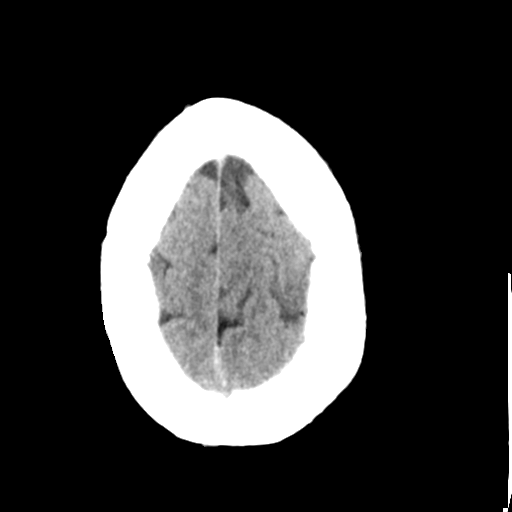
[im 35/38  brain]
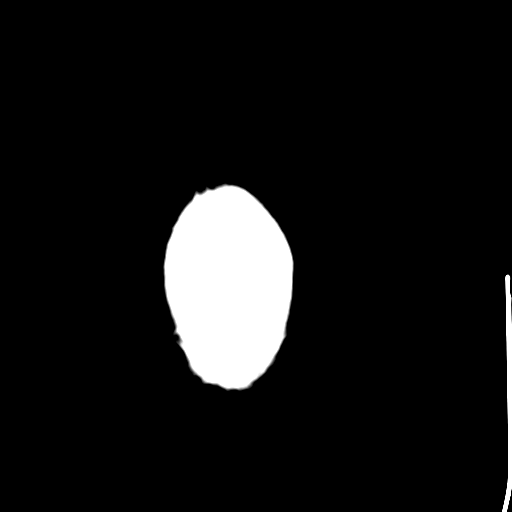
[im 35/38  bone]
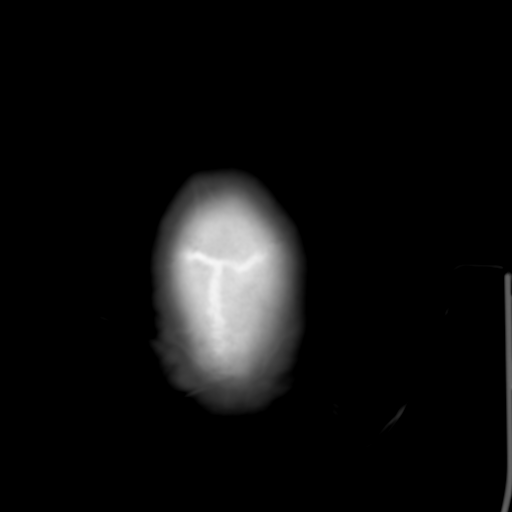

[Series 5: head 3.0 mpr cor · coronal · 0.35mm/px · 3 of 71 slices shown]
[im 24/71  brain]
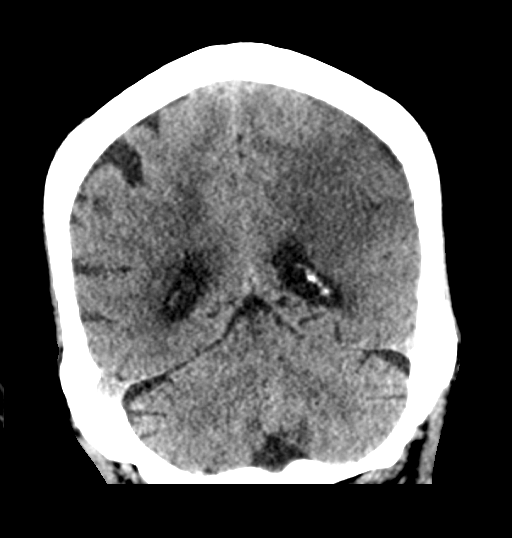
[im 32/71  brain]
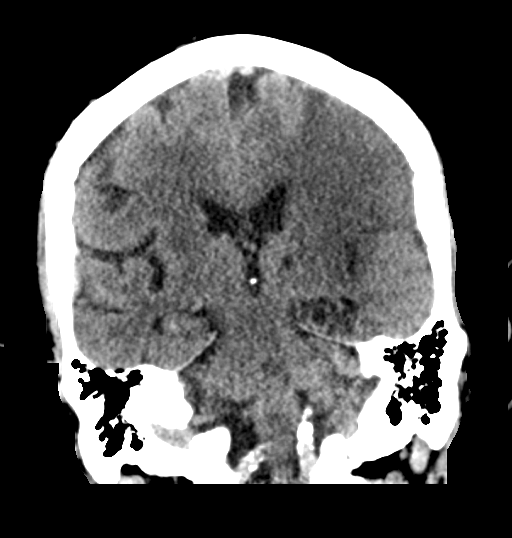
[im 39/71  brain]
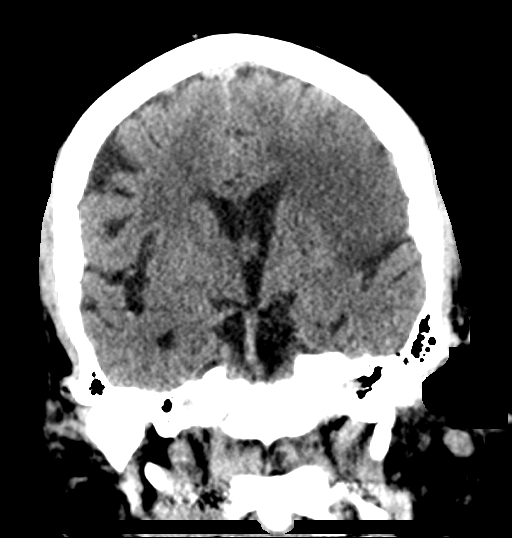

[Series 6: head 3.0 mpr sag · sagittal · 0.36mm/px · 3 of 60 slices shown]
[im 20/60  brain]
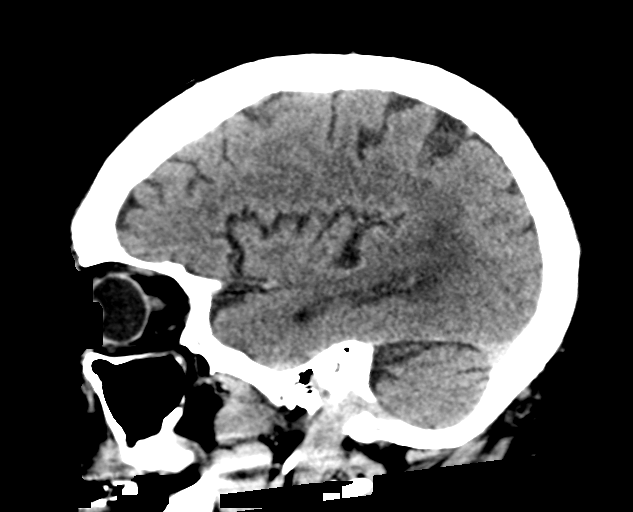
[im 30/60  brain]
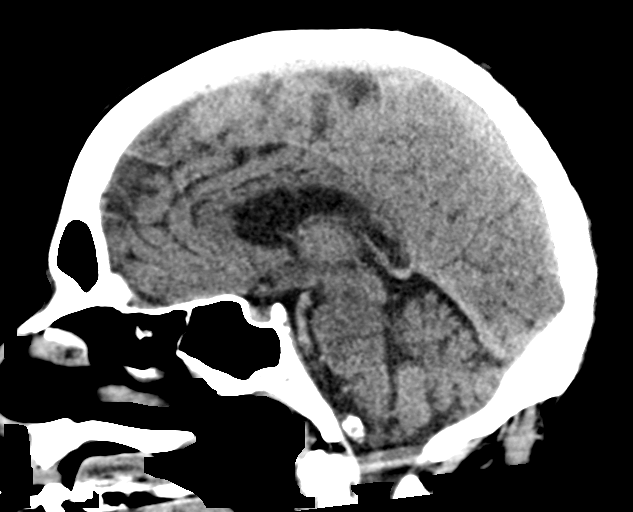
[im 40/60  brain]
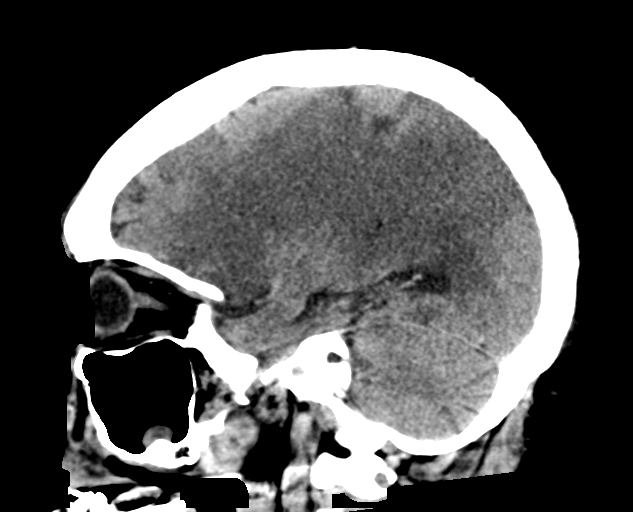

[15 of 47 positions shown; findings below may reference images not displayed]

FINDINGS: Brain: There is loss of gray-white matter differentiation and
effacement of the overlying sulci involving the left frontal,
temporal, and parietal lobes in keeping with a large left acute to
subacute MCA territory infarct. No superimposed acute hemorrhage. No
midline shift.

Moderate periventricular white matter changes are present likely
reflecting the sequela of small vessel ischemia. Mild parenchymal
volume loss is commensurate with the patient's age. Ventricular size
is normal. The cerebellum is unremarkable.

Vascular: No asymmetric hyperdense vasculature at the skull base.
There is extensive atherosclerotic calcification noted within the
carotid siphons and distal vertebral arteries.

Skull: Intact

Sinuses/Orbits: Small mucous retention cyst noted within the left
frontal and maxillary sinuses. Mild mucosal thickening within the a
left sphenoid sinus and several ethmoid air cells bilaterally. No
air-fluid levels. The orbits are unremarkable.

Other: Mastoid air cells and middle ear cavities are clear.
IMPRESSION: Large acute to subacute left MCA territory infarct involving the
frontal, temporal, and parietal cortices. No superimposed acute
hemorrhage. No midline shift.

These results were called by telephone at the time of interpretation
acknowledged these results.

## 2023-01-09 IMAGING — DX DG CHEST 1V PORT
1 series · 1 of 1 positions shown · non-contrast
Comparison: 11/29/2017

CLINICAL DATA: Stroke, altered mental status

EXAM:
PORTABLE CHEST 1 VIEW

[chest]
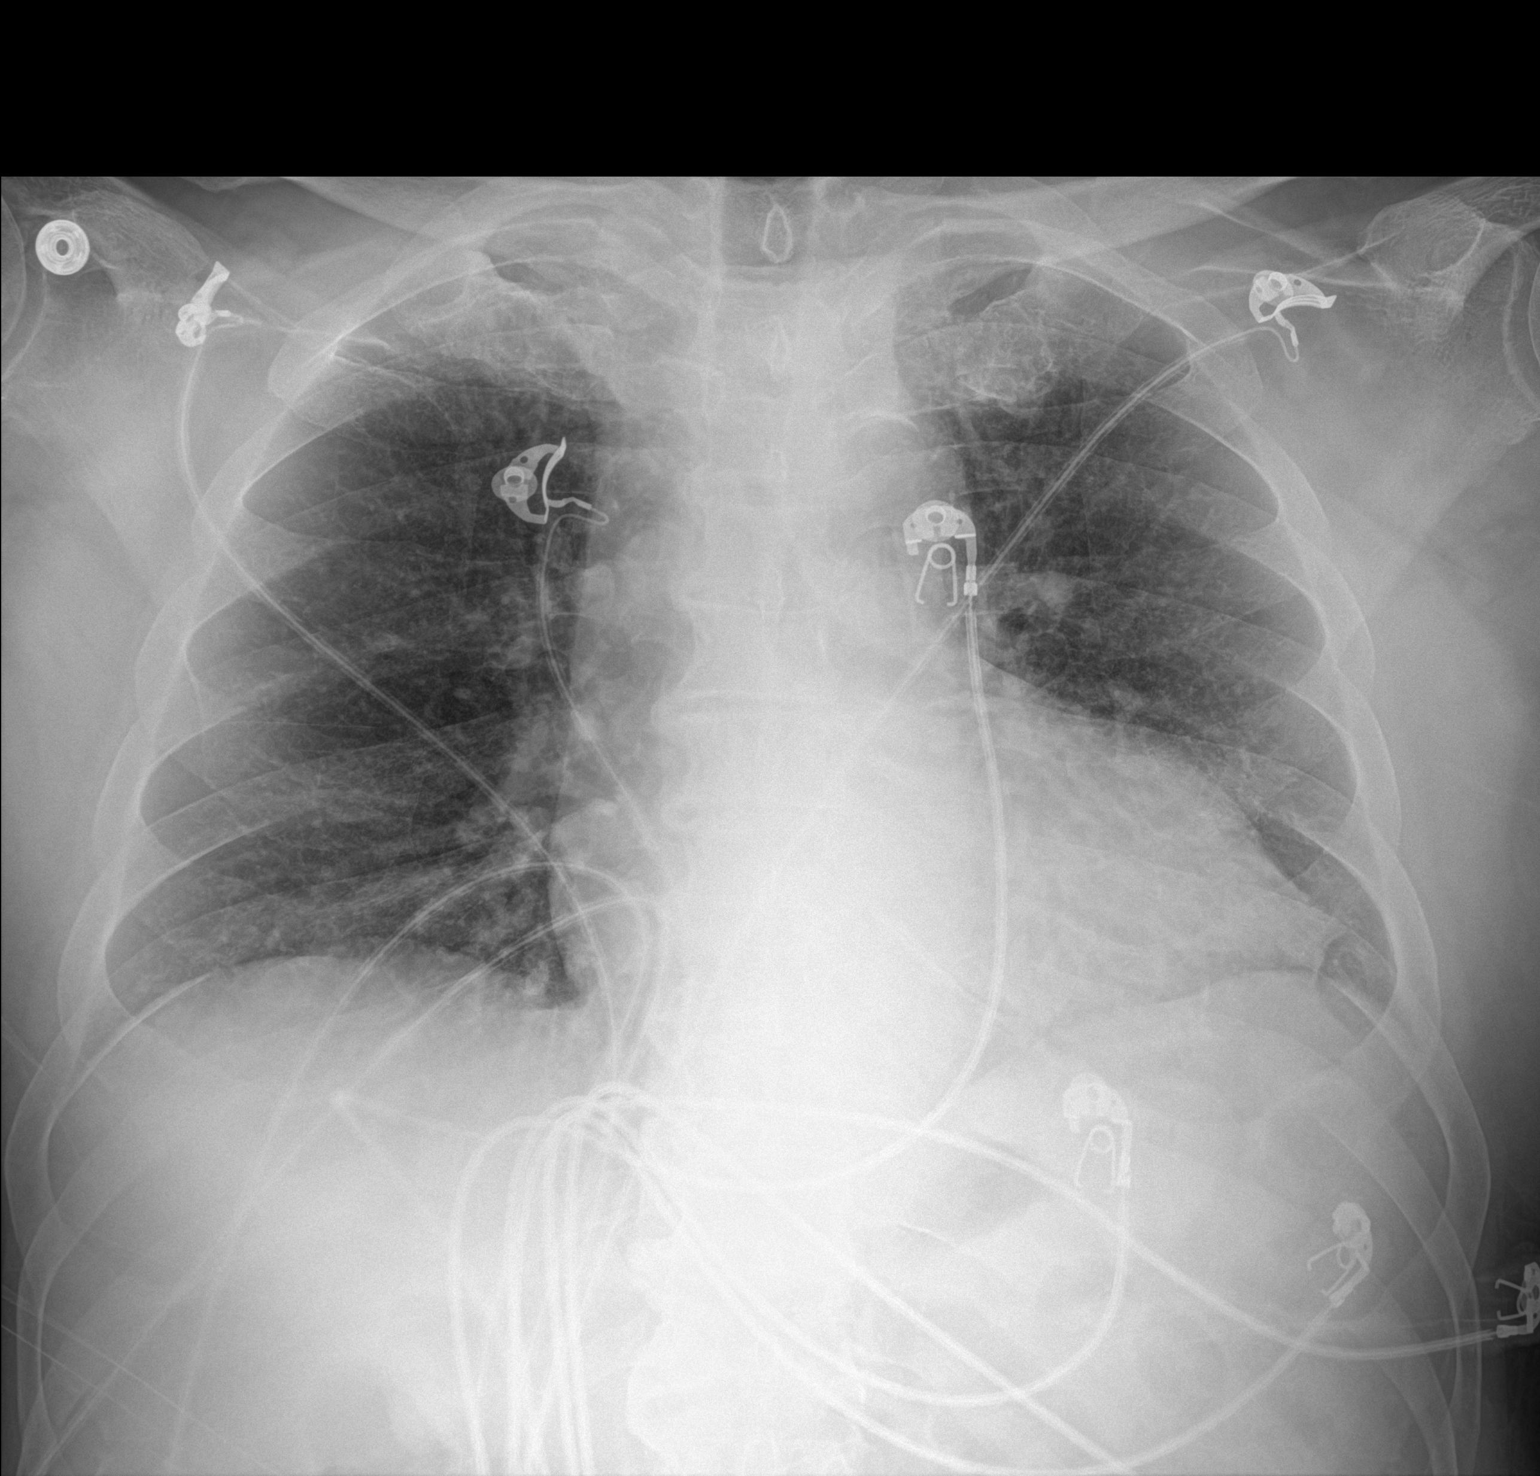

[1 of 1 positions shown; findings below may reference images not displayed]

FINDINGS: Lungs volumes are small, but are symmetric and are clear. No
pneumothorax or pleural effusion. Cardiac size within normal limits.
Pulmonary vascularity is normal. Osseous structures are
age-appropriate. No acute bone abnormality.
IMPRESSION: No active disease.
